# Patient Record
Sex: Male | Born: 1935 | Race: White | Hispanic: No | Marital: Married | Smoking: Never smoker
Health system: Southern US, Community
[De-identification: ages and names within clinical notes are randomized; demographics above are authoritative.]

## PROBLEM LIST (undated history)

## (undated) DIAGNOSIS — C801 Malignant (primary) neoplasm, unspecified: Secondary | ICD-10-CM

## (undated) DIAGNOSIS — I872 Venous insufficiency (chronic) (peripheral): Secondary | ICD-10-CM

## (undated) DIAGNOSIS — I77810 Thoracic aortic ectasia: Secondary | ICD-10-CM

## (undated) DIAGNOSIS — C61 Malignant neoplasm of prostate: Secondary | ICD-10-CM

## (undated) DIAGNOSIS — D509 Iron deficiency anemia, unspecified: Secondary | ICD-10-CM

## (undated) DIAGNOSIS — E785 Hyperlipidemia, unspecified: Secondary | ICD-10-CM

## (undated) DIAGNOSIS — E119 Type 2 diabetes mellitus without complications: Secondary | ICD-10-CM

## (undated) DIAGNOSIS — I10 Essential (primary) hypertension: Secondary | ICD-10-CM

## (undated) DIAGNOSIS — D696 Thrombocytopenia, unspecified: Secondary | ICD-10-CM

## (undated) DIAGNOSIS — G4733 Obstructive sleep apnea (adult) (pediatric): Secondary | ICD-10-CM

## (undated) DIAGNOSIS — I4891 Unspecified atrial fibrillation: Secondary | ICD-10-CM

## (undated) HISTORY — DX: Thrombocytopenia, unspecified: D69.6

## (undated) HISTORY — DX: Malignant neoplasm of prostate: C61

## (undated) HISTORY — DX: Venous insufficiency (chronic) (peripheral): I87.2

## (undated) HISTORY — DX: Hyperlipidemia, unspecified: E78.5

## (undated) HISTORY — DX: Obstructive sleep apnea (adult) (pediatric): G47.33

## (undated) HISTORY — DX: Essential (primary) hypertension: I10

## (undated) HISTORY — DX: Thoracic aortic ectasia: I77.810

## (undated) HISTORY — DX: Unspecified atrial fibrillation: I48.91

## (undated) HISTORY — DX: Malignant (primary) neoplasm, unspecified: C80.1

## (undated) HISTORY — DX: Iron deficiency anemia, unspecified: D50.9

## (undated) HISTORY — DX: Type 2 diabetes mellitus without complications: E11.9

---

## 2004-04-10 ENCOUNTER — Ambulatory Visit: Payer: Self-pay | Admitting: Cardiology

## 2004-04-16 ENCOUNTER — Ambulatory Visit: Payer: Self-pay | Admitting: Cardiology

## 2004-04-23 ENCOUNTER — Ambulatory Visit: Payer: Self-pay | Admitting: Cardiology

## 2004-04-30 ENCOUNTER — Ambulatory Visit: Payer: Self-pay | Admitting: Cardiology

## 2004-05-07 ENCOUNTER — Ambulatory Visit: Payer: Self-pay | Admitting: Cardiology

## 2004-05-14 ENCOUNTER — Ambulatory Visit: Payer: Self-pay | Admitting: Cardiology

## 2004-05-28 ENCOUNTER — Ambulatory Visit: Payer: Self-pay | Admitting: Cardiology

## 2004-06-13 ENCOUNTER — Ambulatory Visit: Payer: Self-pay | Admitting: Cardiology

## 2004-06-21 ENCOUNTER — Ambulatory Visit: Payer: Self-pay | Admitting: Cardiology

## 2004-06-25 ENCOUNTER — Ambulatory Visit: Payer: Self-pay | Admitting: Cardiology

## 2004-06-27 ENCOUNTER — Inpatient Hospital Stay (HOSPITAL_BASED_OUTPATIENT_CLINIC_OR_DEPARTMENT_OTHER): Admission: RE | Admit: 2004-06-27 | Discharge: 2004-06-27 | Payer: Self-pay | Admitting: *Deleted

## 2004-06-27 ENCOUNTER — Ambulatory Visit: Payer: Self-pay | Admitting: *Deleted

## 2004-07-02 ENCOUNTER — Ambulatory Visit: Payer: Self-pay | Admitting: Cardiology

## 2004-07-11 ENCOUNTER — Ambulatory Visit: Payer: Self-pay | Admitting: Cardiology

## 2004-10-18 ENCOUNTER — Ambulatory Visit: Payer: Self-pay | Admitting: Cardiology

## 2004-10-25 ENCOUNTER — Ambulatory Visit: Payer: Self-pay | Admitting: Internal Medicine

## 2004-11-01 ENCOUNTER — Ambulatory Visit: Payer: Self-pay | Admitting: Cardiology

## 2004-11-08 ENCOUNTER — Ambulatory Visit: Payer: Self-pay | Admitting: Internal Medicine

## 2004-11-15 ENCOUNTER — Ambulatory Visit: Payer: Self-pay | Admitting: *Deleted

## 2004-11-22 ENCOUNTER — Ambulatory Visit: Payer: Self-pay | Admitting: Cardiology

## 2004-12-20 ENCOUNTER — Ambulatory Visit: Payer: Self-pay | Admitting: Internal Medicine

## 2005-01-23 ENCOUNTER — Encounter
Admission: RE | Admit: 2005-01-23 | Discharge: 2005-01-23 | Payer: Self-pay | Admitting: Thoracic Surgery (Cardiothoracic Vascular Surgery)

## 2005-02-28 ENCOUNTER — Ambulatory Visit: Payer: Self-pay | Admitting: Internal Medicine

## 2005-03-07 ENCOUNTER — Ambulatory Visit: Payer: Self-pay | Admitting: Cardiology

## 2005-03-14 ENCOUNTER — Ambulatory Visit: Payer: Self-pay | Admitting: *Deleted

## 2005-05-09 ENCOUNTER — Ambulatory Visit: Payer: Self-pay | Admitting: Cardiology

## 2005-06-04 ENCOUNTER — Ambulatory Visit: Payer: Self-pay | Admitting: Cardiology

## 2005-06-17 ENCOUNTER — Ambulatory Visit: Payer: Self-pay | Admitting: Cardiology

## 2005-07-09 ENCOUNTER — Ambulatory Visit: Payer: Self-pay | Admitting: Cardiology

## 2005-07-28 ENCOUNTER — Encounter
Admission: RE | Admit: 2005-07-28 | Discharge: 2005-07-28 | Payer: Self-pay | Admitting: Thoracic Surgery (Cardiothoracic Vascular Surgery)

## 2005-07-29 ENCOUNTER — Ambulatory Visit: Payer: Self-pay | Admitting: Cardiology

## 2005-08-29 ENCOUNTER — Ambulatory Visit: Payer: Self-pay | Admitting: Cardiology

## 2005-09-08 ENCOUNTER — Ambulatory Visit: Payer: Self-pay | Admitting: Cardiology

## 2005-10-22 ENCOUNTER — Ambulatory Visit: Payer: Self-pay | Admitting: Cardiology

## 2005-11-19 ENCOUNTER — Ambulatory Visit: Payer: Self-pay | Admitting: Cardiology

## 2005-12-17 ENCOUNTER — Ambulatory Visit: Payer: Self-pay | Admitting: Cardiology

## 2006-01-26 ENCOUNTER — Ambulatory Visit: Payer: Self-pay | Admitting: Cardiology

## 2006-02-02 ENCOUNTER — Ambulatory Visit: Payer: Self-pay | Admitting: Cardiology

## 2006-02-09 ENCOUNTER — Ambulatory Visit: Payer: Self-pay | Admitting: Cardiology

## 2006-02-12 ENCOUNTER — Encounter: Admission: RE | Admit: 2006-02-12 | Discharge: 2006-02-12 | Payer: Self-pay | Admitting: Internal Medicine

## 2006-03-10 ENCOUNTER — Ambulatory Visit (HOSPITAL_COMMUNITY): Admission: RE | Admit: 2006-03-10 | Discharge: 2006-03-10 | Payer: Self-pay | Admitting: Internal Medicine

## 2006-06-16 ENCOUNTER — Ambulatory Visit: Payer: Self-pay | Admitting: Cardiology

## 2006-07-07 ENCOUNTER — Ambulatory Visit: Payer: Self-pay | Admitting: Cardiology

## 2006-07-21 ENCOUNTER — Ambulatory Visit: Payer: Self-pay | Admitting: Cardiology

## 2006-08-04 ENCOUNTER — Ambulatory Visit: Payer: Self-pay | Admitting: Cardiology

## 2006-08-06 ENCOUNTER — Encounter
Admission: RE | Admit: 2006-08-06 | Discharge: 2006-08-06 | Payer: Self-pay | Admitting: Thoracic Surgery (Cardiothoracic Vascular Surgery)

## 2006-08-06 ENCOUNTER — Ambulatory Visit: Payer: Self-pay | Admitting: Thoracic Surgery (Cardiothoracic Vascular Surgery)

## 2006-08-12 ENCOUNTER — Ambulatory Visit: Payer: Self-pay | Admitting: Cardiology

## 2006-08-12 ENCOUNTER — Encounter: Payer: Self-pay | Admitting: Cardiology

## 2006-08-18 ENCOUNTER — Ambulatory Visit: Payer: Self-pay | Admitting: Cardiology

## 2006-08-21 ENCOUNTER — Ambulatory Visit: Payer: Self-pay | Admitting: Cardiology

## 2006-08-24 ENCOUNTER — Ambulatory Visit: Payer: Self-pay | Admitting: Cardiology

## 2006-08-25 ENCOUNTER — Ambulatory Visit (HOSPITAL_COMMUNITY): Admission: RE | Admit: 2006-08-25 | Discharge: 2006-08-25 | Payer: Self-pay | Admitting: Internal Medicine

## 2006-08-28 ENCOUNTER — Ambulatory Visit: Payer: Self-pay | Admitting: Cardiology

## 2006-09-16 ENCOUNTER — Ambulatory Visit: Payer: Self-pay | Admitting: Cardiology

## 2006-09-23 ENCOUNTER — Ambulatory Visit: Payer: Self-pay | Admitting: Physician Assistant

## 2006-10-27 ENCOUNTER — Ambulatory Visit: Payer: Self-pay | Admitting: Cardiology

## 2006-10-30 ENCOUNTER — Ambulatory Visit: Payer: Self-pay | Admitting: Cardiology

## 2006-11-09 ENCOUNTER — Ambulatory Visit: Payer: Self-pay | Admitting: Physician Assistant

## 2006-11-16 ENCOUNTER — Ambulatory Visit: Payer: Self-pay | Admitting: Physician Assistant

## 2006-11-30 ENCOUNTER — Ambulatory Visit: Payer: Self-pay | Admitting: Cardiology

## 2006-12-22 ENCOUNTER — Ambulatory Visit: Payer: Self-pay | Admitting: Cardiology

## 2007-01-08 ENCOUNTER — Ambulatory Visit: Payer: Self-pay | Admitting: Cardiology

## 2007-01-19 ENCOUNTER — Ambulatory Visit: Payer: Self-pay | Admitting: Cardiology

## 2007-02-16 ENCOUNTER — Ambulatory Visit: Payer: Self-pay | Admitting: Cardiology

## 2007-03-08 ENCOUNTER — Ambulatory Visit: Payer: Self-pay | Admitting: Cardiology

## 2007-03-24 ENCOUNTER — Ambulatory Visit: Payer: Self-pay | Admitting: Cardiology

## 2007-03-30 ENCOUNTER — Ambulatory Visit: Payer: Self-pay | Admitting: Cardiology

## 2007-04-27 ENCOUNTER — Ambulatory Visit: Payer: Self-pay | Admitting: Cardiology

## 2007-05-05 ENCOUNTER — Ambulatory Visit: Payer: Self-pay | Admitting: Cardiology

## 2007-05-19 ENCOUNTER — Ambulatory Visit: Payer: Self-pay | Admitting: Cardiology

## 2007-06-02 ENCOUNTER — Ambulatory Visit: Payer: Self-pay | Admitting: Cardiology

## 2007-06-16 ENCOUNTER — Encounter: Payer: Self-pay | Admitting: Cardiology

## 2007-08-02 ENCOUNTER — Ambulatory Visit: Payer: Self-pay | Admitting: Cardiology

## 2007-08-30 ENCOUNTER — Ambulatory Visit: Payer: Self-pay | Admitting: Cardiology

## 2007-09-28 ENCOUNTER — Ambulatory Visit: Payer: Self-pay | Admitting: Cardiology

## 2007-10-01 ENCOUNTER — Encounter: Payer: Self-pay | Admitting: Cardiology

## 2007-10-18 ENCOUNTER — Encounter: Payer: Self-pay | Admitting: Cardiology

## 2007-10-27 ENCOUNTER — Ambulatory Visit: Payer: Self-pay | Admitting: Cardiology

## 2007-11-24 ENCOUNTER — Ambulatory Visit: Payer: Self-pay | Admitting: Cardiology

## 2007-11-29 ENCOUNTER — Encounter: Payer: Self-pay | Admitting: Cardiology

## 2007-12-02 ENCOUNTER — Encounter: Payer: Self-pay | Admitting: Cardiology

## 2007-12-07 ENCOUNTER — Encounter: Payer: Self-pay | Admitting: Cardiology

## 2007-12-13 ENCOUNTER — Encounter: Payer: Self-pay | Admitting: Cardiology

## 2008-01-03 ENCOUNTER — Ambulatory Visit: Payer: Self-pay | Admitting: Cardiology

## 2008-01-18 ENCOUNTER — Encounter: Payer: Self-pay | Admitting: Cardiology

## 2008-01-20 ENCOUNTER — Ambulatory Visit: Admission: RE | Admit: 2008-01-20 | Discharge: 2008-04-07 | Payer: Self-pay | Admitting: Radiation Oncology

## 2008-02-01 ENCOUNTER — Ambulatory Visit: Payer: Self-pay | Admitting: Cardiology

## 2008-02-15 ENCOUNTER — Ambulatory Visit: Payer: Self-pay | Admitting: Cardiology

## 2008-02-21 ENCOUNTER — Ambulatory Visit: Payer: Self-pay | Admitting: Cardiology

## 2008-02-21 ENCOUNTER — Encounter: Payer: Self-pay | Admitting: Cardiology

## 2008-02-29 ENCOUNTER — Ambulatory Visit: Payer: Self-pay | Admitting: Cardiology

## 2008-03-21 ENCOUNTER — Ambulatory Visit: Payer: Self-pay | Admitting: Cardiology

## 2008-04-18 ENCOUNTER — Ambulatory Visit: Payer: Self-pay | Admitting: Cardiology

## 2008-05-05 HISTORY — PX: OTHER SURGICAL HISTORY: SHX169

## 2008-05-16 ENCOUNTER — Ambulatory Visit: Payer: Self-pay | Admitting: Cardiology

## 2008-06-13 ENCOUNTER — Ambulatory Visit: Payer: Self-pay | Admitting: Cardiology

## 2008-07-21 ENCOUNTER — Ambulatory Visit: Payer: Self-pay | Admitting: Cardiology

## 2008-08-11 ENCOUNTER — Ambulatory Visit: Payer: Self-pay | Admitting: Cardiology

## 2008-09-12 ENCOUNTER — Ambulatory Visit: Payer: Self-pay | Admitting: Cardiology

## 2008-09-18 ENCOUNTER — Encounter: Payer: Self-pay | Admitting: Cardiology

## 2008-10-04 ENCOUNTER — Ambulatory Visit: Payer: Self-pay | Admitting: Cardiology

## 2008-10-13 ENCOUNTER — Ambulatory Visit: Payer: Self-pay | Admitting: Cardiology

## 2008-10-24 ENCOUNTER — Ambulatory Visit: Payer: Self-pay | Admitting: Thoracic Surgery (Cardiothoracic Vascular Surgery)

## 2008-10-24 ENCOUNTER — Encounter: Payer: Self-pay | Admitting: Cardiology

## 2008-10-24 ENCOUNTER — Encounter
Admission: RE | Admit: 2008-10-24 | Discharge: 2008-10-24 | Payer: Self-pay | Admitting: Thoracic Surgery (Cardiothoracic Vascular Surgery)

## 2008-11-07 ENCOUNTER — Encounter: Payer: Self-pay | Admitting: Cardiology

## 2008-11-10 ENCOUNTER — Encounter: Payer: Self-pay | Admitting: Cardiology

## 2008-11-23 ENCOUNTER — Encounter: Payer: Self-pay | Admitting: Cardiology

## 2008-12-18 ENCOUNTER — Encounter: Payer: Self-pay | Admitting: *Deleted

## 2008-12-25 ENCOUNTER — Encounter: Payer: Self-pay | Admitting: Cardiology

## 2009-01-02 ENCOUNTER — Ambulatory Visit: Payer: Self-pay | Admitting: Cardiology

## 2009-01-17 ENCOUNTER — Encounter: Payer: Self-pay | Admitting: Cardiology

## 2009-01-26 ENCOUNTER — Ambulatory Visit: Payer: Self-pay | Admitting: Cardiology

## 2009-01-26 ENCOUNTER — Encounter (INDEPENDENT_AMBULATORY_CARE_PROVIDER_SITE_OTHER): Payer: Self-pay | Admitting: Cardiology

## 2009-01-26 LAB — CONVERTED CEMR LAB: POC INR: 2

## 2009-02-14 ENCOUNTER — Encounter: Payer: Self-pay | Admitting: Cardiology

## 2009-02-19 DIAGNOSIS — I4891 Unspecified atrial fibrillation: Secondary | ICD-10-CM | POA: Insufficient documentation

## 2009-02-20 ENCOUNTER — Encounter: Payer: Self-pay | Admitting: Cardiology

## 2009-02-23 ENCOUNTER — Ambulatory Visit: Payer: Self-pay | Admitting: Cardiology

## 2009-02-23 LAB — CONVERTED CEMR LAB: POC INR: 2.7

## 2009-03-15 ENCOUNTER — Encounter (INDEPENDENT_AMBULATORY_CARE_PROVIDER_SITE_OTHER): Payer: Self-pay | Admitting: *Deleted

## 2009-03-16 ENCOUNTER — Ambulatory Visit: Payer: Self-pay | Admitting: Cardiology

## 2009-03-16 ENCOUNTER — Encounter: Payer: Self-pay | Admitting: Cardiology

## 2009-03-16 DIAGNOSIS — I719 Aortic aneurysm of unspecified site, without rupture: Secondary | ICD-10-CM | POA: Insufficient documentation

## 2009-03-16 DIAGNOSIS — I10 Essential (primary) hypertension: Secondary | ICD-10-CM

## 2009-03-16 DIAGNOSIS — E782 Mixed hyperlipidemia: Secondary | ICD-10-CM

## 2009-03-23 ENCOUNTER — Ambulatory Visit: Payer: Self-pay | Admitting: Cardiology

## 2009-03-30 ENCOUNTER — Ambulatory Visit: Payer: Self-pay | Admitting: Cardiology

## 2009-04-24 ENCOUNTER — Encounter: Payer: Self-pay | Admitting: Cardiology

## 2009-05-08 ENCOUNTER — Ambulatory Visit: Payer: Self-pay | Admitting: Cardiology

## 2009-06-05 ENCOUNTER — Ambulatory Visit: Payer: Self-pay | Admitting: Cardiology

## 2009-06-05 LAB — CONVERTED CEMR LAB: POC INR: 2

## 2009-06-18 ENCOUNTER — Telehealth (INDEPENDENT_AMBULATORY_CARE_PROVIDER_SITE_OTHER): Payer: Self-pay | Admitting: *Deleted

## 2009-08-17 ENCOUNTER — Ambulatory Visit: Payer: Self-pay | Admitting: Cardiology

## 2009-08-17 LAB — CONVERTED CEMR LAB: POC INR: 1.5

## 2009-09-21 ENCOUNTER — Ambulatory Visit: Payer: Self-pay | Admitting: Cardiology

## 2009-10-10 ENCOUNTER — Ambulatory Visit: Payer: Self-pay | Admitting: Thoracic Surgery (Cardiothoracic Vascular Surgery)

## 2009-10-10 ENCOUNTER — Encounter
Admission: RE | Admit: 2009-10-10 | Discharge: 2009-10-10 | Payer: Self-pay | Admitting: Thoracic Surgery (Cardiothoracic Vascular Surgery)

## 2009-10-10 ENCOUNTER — Encounter: Payer: Self-pay | Admitting: Cardiology

## 2009-10-19 ENCOUNTER — Ambulatory Visit: Payer: Self-pay | Admitting: Cardiology

## 2009-10-19 LAB — CONVERTED CEMR LAB: POC INR: 1.8

## 2009-11-13 ENCOUNTER — Ambulatory Visit: Payer: Self-pay | Admitting: Cardiology

## 2009-11-13 LAB — CONVERTED CEMR LAB: POC INR: 1.9

## 2009-12-11 ENCOUNTER — Ambulatory Visit: Payer: Self-pay | Admitting: Cardiology

## 2009-12-11 LAB — CONVERTED CEMR LAB: POC INR: 2.7

## 2010-01-08 ENCOUNTER — Ambulatory Visit: Payer: Self-pay | Admitting: Cardiology

## 2010-05-28 ENCOUNTER — Ambulatory Visit
Admission: RE | Admit: 2010-05-28 | Discharge: 2010-05-28 | Payer: Self-pay | Source: Home / Self Care | Attending: Cardiology | Admitting: Cardiology

## 2010-05-28 ENCOUNTER — Encounter: Payer: Self-pay | Admitting: Cardiology

## 2010-06-04 NOTE — Medication Information (Signed)
Summary: ccr-lr  Anticoagulant Therapy  Managed by: Vashti Hey, RN PCP: Dr. Doreen Beam Supervising MD: Antoine Poche MD, Fayrene Fearing Indication 1: Atrial Fibrillation (ICD-427.31) Lab Used: [facility] [place] Site: [place] INR POC 2.7  Dietary changes: no    Health status changes: no    Bleeding/hemorrhagic complications: no    Recent/future hospitalizations: no    Any changes in medication regimen? no    Recent/future dental: no  Any missed doses?: no       Is patient compliant with meds? yes       Allergies: No Known Drug Allergies  Anticoagulation Management History:      The patient is taking warfarin and comes in today for a routine follow up visit.  Positive risk factors for bleeding include an age of 75 years or older.  The bleeding index is 'intermediate risk'.  Positive CHADS2 values include History of HTN.  Negative CHADS2 values include Age > 60 years old.  The start date was 09/06/2004.  Anticoagulation responsible provider: Antoine Poche MD, Fayrene Fearing.  INR POC: 2.7.  Cuvette Lot#: [id number].  Exp: 10/11.    Anticoagulation Management Assessment/Plan:      The patient's current anticoagulation dose is Warfarin sodium 5 mg tabs: Use as directed by Anticoagulation Clinic.  The target INR is 2 - 3.  The next INR is due 01/08/2010.  Anticoagulation instructions were given to patient.  Results were reviewed/authorized by Vashti Hey, RN.  He was notified by Vashti Hey RN.         Prior Anticoagulation Instructions: INR 1.9 Take coumadin 10mg  x 2 then resume 7.5mg  once daily except 5mg  on Fridays  Current Anticoagulation Instructions: INR 2.7 Continue coumadin 7.5mg  once daily except 5mg  on Fridays

## 2010-06-04 NOTE — Medication Information (Signed)
Summary: ccr-lr  Anticoagulant Therapy  Managed by: Vashti Hey, RN PCP: Dr. Doreen Beam Supervising MD: Andee Lineman MD, Michelle Piper Indication 1: Atrial Fibrillation (ICD-427.31) Lab Used: [facility] [place] Site: [place] INR POC 1.8  Dietary changes: no    Health status changes: no    Bleeding/hemorrhagic complications: no    Recent/future hospitalizations: no    Any changes in medication regimen? no    Recent/future dental: no  Any missed doses?: no       Is patient compliant with meds? yes       Allergies: No Known Drug Allergies  Anticoagulation Management History:      The patient is taking warfarin and comes in today for a routine follow up visit.  Positive risk factors for bleeding include an age of 75 years or older.  The bleeding index is 'intermediate risk'.  Positive CHADS2 values include History of HTN.  Negative CHADS2 values include Age > 61 years old.  The start date was 09/06/2004.  Anticoagulation responsible provider: Andee Lineman MD, Michelle Piper.  INR POC: 1.8.  Cuvette Lot#: [id number].  Exp: 10/11.    Anticoagulation Management Assessment/Plan:      The patient's current anticoagulation dose is Warfarin sodium 5 mg tabs: Use as directed by Anticoagulation Clinic.  The target INR is 2 - 3.  The next INR is due 11/13/2009.  Anticoagulation instructions were given to patient.  Results were reviewed/authorized by Vashti Hey, RN.  He was notified by Vashti Hey RN.         Prior Anticoagulation Instructions: INR 3.0 Continue coumadin 7.5mg  once daily except 5mg  on Fridays  Current Anticoagulation Instructions: INR 1.8 Take coumadin 7.5mg  once daily except 5mg  on Fridays

## 2010-06-04 NOTE — Medication Information (Signed)
Summary: ccr ck  Anticoagulant Therapy  Managed by: Darren Hey, RN PCP: Dr. Doreen Beam Supervising MD: Andee Lineman MD, Michelle Piper Indication 1: Atrial Fibrillation (ICD-427.31) Lab Used: [facility] [place] Site: [place] INR POC 1.5  Dietary changes: no    Health status changes: no    Bleeding/hemorrhagic complications: no    Recent/future hospitalizations: no    Any changes in medication regimen? no    Recent/future dental: no  Any missed doses?: no       Is patient compliant with meds? yes       Allergies: No Known Drug Allergies  Anticoagulation Management History:      The patient is taking warfarin and comes in today for a routine follow up visit.  Positive risk factors for bleeding include an age of 75 years or older.  The bleeding index is 'intermediate risk'.  Positive CHADS2 values include History of HTN.  Negative CHADS2 values include Age > 41 years old.  The start date was 09/06/2004.  Anticoagulation responsible provider: Andee Lineman MD, Michelle Piper.  INR POC: 1.5.  Cuvette Lot#: [id number].  Exp: 10/11.    Anticoagulation Management Assessment/Plan:      The patient's current anticoagulation dose is Warfarin sodium 5 mg tabs: Use as directed by Anticoagulation Clinic.  The target INR is 2 - 3.  The next INR is due 09/07/2009.  Anticoagulation instructions were given to patient.  Results were reviewed/authorized by Darren Hey, RN.  He was notified by Darren Hey RN.         Prior Anticoagulation Instructions: INR 2.0 Increase coumadin to 7.5mg  once daily except 5mg  on M,W,F  Current Anticoagulation Instructions: INR 1.5 Take coumadin 2 tablets tonight and tomorrow night then increase coumadin to 1 1/2 tablets once daily except 1 tablet on Fridays

## 2010-06-04 NOTE — Progress Notes (Signed)
Summary: COUMADIN STOP DATE PRIOR/TEETH EXTRACTIONS   Phone Note Call from Patient   Caller: Patient Call For: nurse Summary of Call: patient having teeth extraction and want to know when to stop coumadin before procedure. Nurse informed patient via message machine to have dentist office contact us directly.  Pt calling about samething need to get scheduled asap. Initial call taken by: Carlye Grippe,  June 18, 2009 11:44 AM  Follow-up for Phone Call        Dentist office is Dr.George Stermer [phone]. called and left message on machine that we need request r/e coumadin to come from their office either by fax or phone. fax-[phone] Follow-up by: Carlye Grippe,  June 19, 2009 1:25 PM  Additional Follow-up for Phone Call Additional follow up Details #1::        spoke with staff at dental office and they confirmed that patient is having one extraction , add a class to existing partial procedure and would like to know when to stop coumadin prior to procedure?  Additional Follow-up by: Carlye Grippe,  June 19, 2009 2:07 PM    Additional Follow-up for Phone Call Additional follow up Details #2::    Is it OK fro pt to hold couamdin 3 days prior to dental extraction? Follow-up by: Vashti Hey RN,  June 20, 2009 1:02 PM  Additional Follow-up for Phone Call Additional follow up Details #3:: Details for Additional Follow-up Action Taken: Could hold Coumadin 3-4 days prior to procedure and resume as soon after procedure as felt safe from a bleeding and healing perspective.  He will need a f/u PT/INR 4-5 days after resuming Coumadin.  information faxed to the dentist office.  Additional Follow-up by: Loreli Slot, MD, [facility],  June 20, 2009 7:39 PM

## 2010-06-04 NOTE — Medication Information (Signed)
Summary: ccr-lr  Anticoagulant Therapy  Managed by: Vashti Hey, RN PCP: Dr. Doreen Beam Supervising MD: Andee Lineman MD, Michelle Piper Indication 1: Atrial Fibrillation (ICD-427.31) Lab Used: [facility] [place] Site: [place] INR POC 2.0  Dietary changes: no    Health status changes: no    Bleeding/hemorrhagic complications: no    Recent/future hospitalizations: no    Any changes in medication regimen? no    Recent/future dental: no  Any missed doses?: no       Is patient compliant with meds? yes       Allergies: No Known Drug Allergies  Anticoagulation Management History:      The patient is taking warfarin and comes in today for a routine follow up visit.  Positive risk factors for bleeding include an age of 75 years or older.  The bleeding index is 'intermediate risk'.  Positive CHADS2 values include History of HTN.  Negative CHADS2 values include Age > 63 years old.  The start date was 09/06/2004.  Anticoagulation responsible provider: Andee Lineman MD, Michelle Piper.  INR POC: 2.0.  Cuvette Lot#: [id number].  Exp: 10/11.    Anticoagulation Management Assessment/Plan:      The patient's current anticoagulation dose is Warfarin sodium 5 mg tabs: Use as directed by Anticoagulation Clinic.  The target INR is 2 - 3.  The next INR is due 06/29/2009.  Anticoagulation instructions were given to patient.  Results were reviewed/authorized by Vashti Hey, RN.  He was notified by Vashti Hey RN.         Prior Anticoagulation Instructions: INR 2.0 Take coumadin 10mg  tonight then resume 5mg  once daily excdept 7.5mg  on T,Th,Sat  Current Anticoagulation Instructions: INR 2.0 Increase coumadin to 7.5mg  once daily except 5mg  on M,W,F

## 2010-06-04 NOTE — Medication Information (Signed)
Summary: ccr-lr  Anticoagulant Therapy  Managed by: Vashti Hey, RN PCP: Dr. Doreen Beam Supervising MD: Antoine Poche MD, Fayrene Fearing Indication 1: Atrial Fibrillation (ICD-427.31) Lab Used: [facility] [place] Site: [place] INR POC 3.1  Dietary changes: no    Health status changes: no    Bleeding/hemorrhagic complications: no    Recent/future hospitalizations: no    Any changes in medication regimen? no    Recent/future dental: no  Any missed doses?: no       Is patient compliant with meds? yes       Allergies: No Known Drug Allergies  Anticoagulation Management History:      The patient is taking warfarin and comes in today for a routine follow up visit.  Positive risk factors for bleeding include an age of 75 years or older.  The bleeding index is 'intermediate risk'.  Positive CHADS2 values include History of HTN.  Negative CHADS2 values include Age > 2 years old.  The start date was 09/06/2004.  Anticoagulation responsible provider: Antoine Poche MD, Fayrene Fearing.  INR POC: 3.1.  Cuvette Lot#: [id number].  Exp: 10/11.    Anticoagulation Management Assessment/Plan:      The patient's current anticoagulation dose is Warfarin sodium 5 mg tabs: Use as directed by Anticoagulation Clinic.  The target INR is 2 - 3.  The next INR is due 02/05/2010.  Anticoagulation instructions were given to patient.  Results were reviewed/authorized by Vashti Hey, RN.  He was notified by Vashti Hey RN.         Prior Anticoagulation Instructions: INR 2.7 Continue coumadin 7.5mg  once daily except 5mg  on Fridays  Current Anticoagulation Instructions: INR 3.1 Continue coumadin 7.5mg  once daily except 5mg  on Fridays

## 2010-06-04 NOTE — Letter (Signed)
Summary: [facility]   [facility]   Imported By: Roderic Ovens 11/27/2009 11:47:19  _____________________________________________________________________  External Attachment:    Type:   Image     Comment:   External Document

## 2010-06-04 NOTE — Medication Information (Signed)
Summary: ccr-lr  Anticoagulant Therapy  Managed by: Vashti Hey, RN PCP: Dr. Doreen Beam Supervising MD: Andee Lineman MD, Michelle Piper Indication 1: Atrial Fibrillation (ICD-427.31) Lab Used: [facility] [place] Site: [place] INR POC 1.9  Dietary changes: no    Health status changes: no    Bleeding/hemorrhagic complications: no    Recent/future hospitalizations: no    Any changes in medication regimen? no    Recent/future dental: no  Any missed doses?: yes     Details: might have missed a dose  wife has been out of town and she prepares meds  Is patient compliant with meds? yes       Allergies: No Known Drug Allergies  Anticoagulation Management History:      The patient is taking warfarin and comes in today for a routine follow up visit.  Positive risk factors for bleeding include an age of 11 years or older.  The bleeding index is 'intermediate risk'.  Positive CHADS2 values include History of HTN.  Negative CHADS2 values include Age > 18 years old.  The start date was 09/06/2004.  Anticoagulation responsible provider: Andee Lineman MD, Michelle Piper.  INR POC: 1.9.  Cuvette Lot#: [id number].  Exp: 10/11.    Anticoagulation Management Assessment/Plan:      The patient's current anticoagulation dose is Warfarin sodium 5 mg tabs: Use as directed by Anticoagulation Clinic.  The target INR is 2 - 3.  The next INR is due 12/11/2009.  Anticoagulation instructions were given to patient.  Results were reviewed/authorized by Vashti Hey, RN.  He was notified by Vashti Hey RN.         Prior Anticoagulation Instructions: INR 1.8 Take coumadin 7.5mg  once daily except 5mg  on Fridays  Current Anticoagulation Instructions: INR 1.9 Take coumadin 10mg  x 2 then resume 7.5mg  once daily except 5mg  on Fridays

## 2010-06-04 NOTE — Medication Information (Signed)
Summary: [facility]-MISSED APPT 5/13 B/C FORGOT-[facility]  Anticoagulant Therapy  Managed by: Vashti Hey, RN PCP: Dr. Doreen Beam Supervising MD: Andee Lineman MD, Michelle Piper Indication 1: Atrial Fibrillation (ICD-427.31) Lab Used: [facility] [place] Site: [place] INR POC 3.0  Dietary changes: no    Health status changes: no    Bleeding/hemorrhagic complications: no    Recent/future hospitalizations: no    Any changes in medication regimen? no    Recent/future dental: no  Any missed doses?: no       Is patient compliant with meds? yes       Allergies: No Known Drug Allergies  Anticoagulation Management History:      The patient is taking warfarin and comes in today for a routine follow up visit.  Positive risk factors for bleeding include an age of 75 years or older.  The bleeding index is 'intermediate risk'.  Positive CHADS2 values include History of HTN.  Negative CHADS2 values include Age > 75 years old.  The start date was 09/06/2004.  Anticoagulation responsible provider: Andee Lineman MD, Michelle Piper.  INR POC: 3.0.  Cuvette Lot#: [id number].  Exp: 10/11.    Anticoagulation Management Assessment/Plan:      The patient's current anticoagulation dose is Warfarin sodium 5 mg tabs: Use as directed by Anticoagulation Clinic.  The target INR is 2 - 3.  The next INR is due 10/19/2009.  Anticoagulation instructions were given to patient.  Results were reviewed/authorized by Vashti Hey, RN.  He was notified by Vashti Hey RN.         Prior Anticoagulation Instructions: INR 1.5 Take coumadin 2 tablets tonight and tomorrow night then increase coumadin to 1 1/2 tablets once daily except 1 tablet on Fridays  Current Anticoagulation Instructions: INR 3.0 Continue coumadin 7.5mg  once daily except 5mg  on Fridays

## 2010-06-04 NOTE — Medication Information (Signed)
Summary: [facility]-R/S FROM NO SHOW  Anticoagulant Therapy  Managed by: Vashti Hey, RN PCP: Dr. Doreen Beam Supervising MD: Andee Lineman MD, Michelle Piper Indication 1: Atrial Fibrillation (ICD-427.31) Lab Used: [facility] [place] Site: [place] INR POC 2.0  Dietary changes: no    Health status changes: no    Bleeding/hemorrhagic complications: no    Recent/future hospitalizations: no    Any changes in medication regimen? no    Recent/future dental: no  Any missed doses?: no       Is patient compliant with meds? yes       Allergies: No Known Drug Allergies  Anticoagulation Management History:      The patient is taking warfarin and comes in today for a routine follow up visit.  Positive risk factors for bleeding include an age of 75 years or older.  The bleeding index is 'intermediate risk'.  Positive CHADS2 values include History of HTN.  Negative CHADS2 values include Age > 63 years old.  The start date was 09/06/2004.  Anticoagulation responsible provider: Andee Lineman MD, Michelle Piper.  INR POC: 2.0.  Cuvette Lot#: [id number].  Exp: 10/11.    Anticoagulation Management Assessment/Plan:      The patient's current anticoagulation dose is Warfarin sodium 5 mg tabs: Use as directed by Anticoagulation Clinic.  The target INR is 2 - 3.  The next INR is due 06/05/2009.  Anticoagulation instructions were given to patient.  Results were reviewed/authorized by Vashti Hey, RN.  He was notified by Vashti Hey RN.         Prior Anticoagulation Instructions: INR 1.9 Take coumadin 10mg  tonight then resume 5mg  once daily except 7.5mg  on T,Th,Sat  Current Anticoagulation Instructions: INR 2.0 Take coumadin 10mg  tonight then resume 5mg  once daily excdept 7.5mg  on T,Th,Sat

## 2010-06-06 NOTE — Medication Information (Signed)
Summary: Coumadin Clinic  Anticoagulant Therapy  Managed by: Vashti Hey, RN PCP: Dr. Doreen Beam Supervising MD: Diona Browner MD, Remi Deter Indication 1: Atrial Fibrillation (ICD-427.31) Lab Used: [facility] [place] Site: [place] INR POC 2.4  Dietary changes: no    Health status changes: no    Bleeding/hemorrhagic complications: no    Recent/future hospitalizations: no    Any changes in medication regimen? no     Any missed doses?: yes     Details: Missed yesterday  Is patient compliant with meds? yes       Allergies: No Known Drug Allergies  Anticoagulation Management History:      The patient is taking warfarin and comes in today for a routine follow up visit.  Positive risk factors for bleeding include an age of 39 years or older.  The bleeding index is 'intermediate risk'.  Positive CHADS2 values include History of HTN and Age > 62 years old.  The start date was 09/06/2004.  Anticoagulation responsible provider: Diona Browner MD, Remi Deter.  INR POC: 2.4.  Cuvette Lot#: [id number].  Exp: 10/11.    Anticoagulation Management Assessment/Plan:      The patient's current anticoagulation dose is Warfarin sodium 5 mg tabs: Use as directed by Anticoagulation Clinic.  The target INR is 2 - 3.  The next INR is due 06/25/2010.  Anticoagulation instructions were given to patient.  Results were reviewed/authorized by Vashti Hey, RN.  He was notified by Vashti Hey RN.         Prior Anticoagulation Instructions: INR 3.1 Continue coumadin 7.5mg  once daily except 5mg  on Fridays  Current Anticoagulation Instructions: INR 2.4 Continue coumadin 7.5mg  once daily except 5mg  on Fridays

## 2010-06-25 ENCOUNTER — Encounter: Payer: Self-pay | Admitting: Cardiology

## 2010-06-25 ENCOUNTER — Encounter (INDEPENDENT_AMBULATORY_CARE_PROVIDER_SITE_OTHER): Payer: Medicare Other

## 2010-06-25 DIAGNOSIS — I4891 Unspecified atrial fibrillation: Secondary | ICD-10-CM

## 2010-06-25 DIAGNOSIS — Z7901 Long term (current) use of anticoagulants: Secondary | ICD-10-CM

## 2010-06-25 LAB — CONVERTED CEMR LAB: POC INR: 2.5

## 2010-07-02 NOTE — Medication Information (Signed)
Summary: ccr-lr  Anticoagulant Therapy  Managed by: Vashti Hey, RN PCP: Dr. Doreen Beam Supervising MD: Diona Browner MD, Remi Deter Indication 1: Atrial Fibrillation (ICD-427.31) Lab Used: [facility] [place] Site: [place] INR POC 2.5  Dietary changes: no    Health status changes: no    Bleeding/hemorrhagic complications: no    Recent/future hospitalizations: no    Any changes in medication regimen? no    Recent/future dental: no  Any missed doses?: no       Is patient compliant with meds? yes       Allergies: No Known Drug Allergies  Anticoagulation Management History:      The patient is taking warfarin and comes in today for a routine follow up visit.  Positive risk factors for bleeding include an age of 75 years or older.  The bleeding index is 'intermediate risk'.  Positive CHADS2 values include History of HTN and Age > 75 years old.  The start date was 09/06/2004.  Anticoagulation responsible provider: Diona Browner MD, Remi Deter.  INR POC: 2.5.  Cuvette Lot#: [id number].  Exp: 10/11.    Anticoagulation Management Assessment/Plan:      The patient's current anticoagulation dose is Warfarin sodium 5 mg tabs: Use as directed by Anticoagulation Clinic.  The target INR is 2 - 3.  The next INR is due 07/23/2010.  Anticoagulation instructions were given to patient.  Results were reviewed/authorized by Vashti Hey, RN.  He was notified by Vashti Hey RN.         Prior Anticoagulation Instructions: INR 2.4 Continue coumadin 7.5mg  once daily except 5mg  on Fridays  Current Anticoagulation Instructions: INR 2.5 Continue coumadin 7.5mg  once daily except 5mg  on Fridays

## 2010-07-09 ENCOUNTER — Ambulatory Visit: Payer: Self-pay | Admitting: Cardiology

## 2010-07-11 ENCOUNTER — Ambulatory Visit: Payer: Self-pay | Admitting: Cardiology

## 2010-07-19 ENCOUNTER — Encounter: Payer: Self-pay | Admitting: Cardiology

## 2010-07-19 DIAGNOSIS — I4891 Unspecified atrial fibrillation: Secondary | ICD-10-CM

## 2010-07-19 DIAGNOSIS — Z7901 Long term (current) use of anticoagulants: Secondary | ICD-10-CM | POA: Insufficient documentation

## 2010-07-23 ENCOUNTER — Ambulatory Visit (INDEPENDENT_AMBULATORY_CARE_PROVIDER_SITE_OTHER): Payer: Medicare Other | Admitting: *Deleted

## 2010-07-23 DIAGNOSIS — I4891 Unspecified atrial fibrillation: Secondary | ICD-10-CM

## 2010-07-23 DIAGNOSIS — Z7901 Long term (current) use of anticoagulants: Secondary | ICD-10-CM

## 2010-07-23 LAB — POCT INR: INR: 2.6

## 2010-07-23 NOTE — Patient Instructions (Signed)
INR  2.6 Continue coumadin 7.5mg  qd except 5mg  on Fridays

## 2010-08-01 ENCOUNTER — Ambulatory Visit: Payer: Medicare Other | Admitting: Cardiology

## 2010-08-13 ENCOUNTER — Encounter: Payer: Self-pay | Admitting: Cardiology

## 2010-08-13 NOTE — Progress Notes (Signed)
This encounter was created in error - please disregard.

## 2010-08-16 ENCOUNTER — Encounter: Payer: Self-pay | Admitting: Cardiology

## 2010-08-19 ENCOUNTER — Ambulatory Visit (INDEPENDENT_AMBULATORY_CARE_PROVIDER_SITE_OTHER): Payer: Medicare Other | Admitting: Cardiology

## 2010-08-19 ENCOUNTER — Encounter: Payer: Self-pay | Admitting: Cardiology

## 2010-08-19 VITALS — BP 122/70 | HR 75 | Ht 70.0 in | Wt 305.0 lb

## 2010-08-19 DIAGNOSIS — I719 Aortic aneurysm of unspecified site, without rupture: Secondary | ICD-10-CM

## 2010-08-19 DIAGNOSIS — I4891 Unspecified atrial fibrillation: Secondary | ICD-10-CM

## 2010-08-19 DIAGNOSIS — I10 Essential (primary) hypertension: Secondary | ICD-10-CM

## 2010-08-19 NOTE — Assessment & Plan Note (Signed)
Ascending thoracic aortic aneurysm measuring 4.6 cm, followed by Dr. Dorris Fetch.

## 2010-08-19 NOTE — Progress Notes (Signed)
Clinical Summary Darren Parker is a 77 y.o.male presenting for followup. He was last seen in November 2010. Continues to follow in the Coumadin clinic. Last visit with thoracic surgery was in June 2011. Followup chest CT is anticipated this summer.  He reports no chest pain, has NYHA class 2-3 dyspnea on exertion which is stable. No reported palpitations or bleeding problems, continues on Coumadin.  I recheck blood pressure during his visit today, noted above. Suspect that the original measurement was inaccurate.   Allergies  Allergen Reactions  . Sulfa Drugs Cross Reactors     Current outpatient prescriptions:allopurinol (ZYLOPRIM) 300 MG tablet, Take 300 mg by mouth daily. , Disp: , Rfl: ;  amLODipine (NORVASC) 5 MG tablet, Take 5 mg by mouth daily.  , Disp: , Rfl: ;  calcium-vitamin D (OSCAL) 250-125 MG-UNIT per tablet, Take 1 tablet by mouth 2 (two) times daily.  , Disp: , Rfl: ;  carvedilol (COREG) 6.25 MG tablet, Take 6.25 mg by mouth 2 (two) times daily with a meal.  , Disp: , Rfl:  Cholecalciferol (VITAMIN D) 1000 UNITS capsule, Take 1,000 Units by mouth daily.  , Disp: , Rfl: ;  doxazosin (CARDURA) 4 MG tablet, Take 4 mg by mouth at bedtime. , Disp: , Rfl: ;  levothyroxine (SYNTHROID, LEVOTHROID) 112 MCG tablet, Take 112 mcg by mouth daily. , Disp: , Rfl: ;  metFORMIN (GLUCOPHAGE) 500 MG tablet, Take 500 mg by mouth 2 (two) times daily with a meal. 2 tabs bid, Disp: , Rfl:  potassium chloride SA (K-DUR,KLOR-CON) 20 MEQ tablet, Take 20 mEq by mouth 2 (two) times daily. , Disp: , Rfl: ;  pravastatin (PRAVACHOL) 40 MG tablet, Take 40 mg by mouth daily.  , Disp: , Rfl: ;  triamterene-hydrochlorothiazide (MAXZIDE-25) 37.5-25 MG per tablet, Take 1 tablet by mouth daily.  , Disp: , Rfl: ;  warfarin (COUMADIN) 5 MG tablet, Take by mouth as directed.  , Disp: , Rfl:   Past Medical History  Diagnosis Date  . Atrial fibrillation   . Hyperlipidemia   . Hypertension   . Obstructive sleep apnea     . Type 2 diabetes mellitus   . Dilation of thoracic aorta     Ascending - CT scan 10/2008, 4.3 cm proximal   . 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 Prostate cancer   . Thrombocytopenia   . Iron deficiency anemia   . Venous insufficiency     Social History Darren Parker reports that he has never smoked. He has never used smokeless tobacco. Darren Parker reports that he does not drink alcohol.  Review of Systems Reports increased weight, complicated by hormone treatments for prostate cancer. Not exercising regularly. Otherwise reviewed and negative except as outlined.  Physical Examination Filed Vitals:   08/19/10 0915  BP: 122/70  Pulse: 75  Morbidly obese male, in no acute distress. HEENT: Conjunctiva and lids normal, oropharynx with moist mucosa. Neck: Increased girth, no obvious carotid bruits or thyromegaly. Lungs: Clear to auscultation, nonlabored. Diminished. Cardiac: Indistinct PMI, irregularly irregular, no S3 gallop. Abdomen: No hepatomegaly, bowel sounds present, nontender. Extremities: Chronic venous stasis and 1+ pitting edema with 1+ pulses. Skin: Warm and dry. No ecchymosis or petechia. Musculoskeletal: No gross deformities. Neuropsychiatric: Alert and oriented x3, affect appropriate.   Problem List and Plan

## 2010-08-19 NOTE — Assessment & Plan Note (Signed)
Recheck blood pressure today was normal. No changes made to present regimen. I encouraged weight loss, sodium restriction. Continue follow up with primary care provider.

## 2010-08-19 NOTE — Assessment & Plan Note (Signed)
Rate controlled today, reportedly tolerating Coumadin without bleeding problems. Continue regular follow up in the Coumadin clinic.

## 2010-08-19 NOTE — Patient Instructions (Signed)
   The current medical regimen is effective;  continue present plan and medications.  Your physician wants you to follow up in:  1 year.  You will receive a reminder letter in the mail one-two months in advance.  If you don't receive a letter, please call our office to schedule the follow up appointment

## 2010-08-20 ENCOUNTER — Ambulatory Visit (INDEPENDENT_AMBULATORY_CARE_PROVIDER_SITE_OTHER): Payer: Medicare Other | Admitting: *Deleted

## 2010-08-20 DIAGNOSIS — Z7901 Long term (current) use of anticoagulants: Secondary | ICD-10-CM

## 2010-08-20 DIAGNOSIS — I4891 Unspecified atrial fibrillation: Secondary | ICD-10-CM

## 2010-08-20 LAB — POCT INR: INR: 2.4

## 2010-09-03 ENCOUNTER — Other Ambulatory Visit: Payer: Self-pay | Admitting: Thoracic Surgery (Cardiothoracic Vascular Surgery)

## 2010-09-03 DIAGNOSIS — I712 Thoracic aortic aneurysm, without rupture: Secondary | ICD-10-CM

## 2010-09-03 DIAGNOSIS — C8590 Non-Hodgkin lymphoma, unspecified, unspecified site: Secondary | ICD-10-CM

## 2010-09-03 DIAGNOSIS — C61 Malignant neoplasm of prostate: Secondary | ICD-10-CM

## 2010-09-17 ENCOUNTER — Ambulatory Visit (INDEPENDENT_AMBULATORY_CARE_PROVIDER_SITE_OTHER): Payer: Medicare Other | Admitting: *Deleted

## 2010-09-17 DIAGNOSIS — I4891 Unspecified atrial fibrillation: Secondary | ICD-10-CM

## 2010-09-17 DIAGNOSIS — Z7901 Long term (current) use of anticoagulants: Secondary | ICD-10-CM

## 2010-09-17 NOTE — Assessment & Plan Note (Signed)
OFFICE VISIT   Darren Parker, Darren Parker  DOB:  1932-07-13                                        October 24, 2008  CHART #:  [id number]   The patient is a 77 year old gentleman, who has been followed in the  office since April 2006 with enlarged ascending aorta on last evaluation  in April 2008.  This aneurysm was stable.  He now returns with a  followup scan.   Past medical history is reviewed and is on the chart.  He has a history  of hypertension, hypercholesterolemia, sleep apnea, arthritis, gout,  obesity, venous insufficiency, chronic atrial fibrillation, obstructive  sleep apnea.  He has had multiple previous orthopedic procedures.   His current medications are metformin 500 mg b.i.d., Benicar 20 mg  daily, metoprolol 25 mg b.i.d., Crestor 10 mg daily, levothyroxine 112  mcg daily, allopurinol 300 mg daily, potassium 20 mEq daily, and  Coumadin.   REVIEW OF SYSTEMS:  He has gained some weight.  He is 5 feet 10, weighs  295.  He does have problems of reflux.  He has been taking diuretics,  which is causing him to urinate frequently.  He has arthritis and joint  pain.  All other systems are negative.   PHYSICAL EXAMINATION:  GENERAL:  The patient is an obese 77 year old  gentleman in no acute distress.  VITAL SIGNS:  Blood pressure 148/82, pulse 69, respirations 18, and his  oxygen saturation is 97% on room air.  CARDIAC:  Normal S1 and S2.  There is an S4.  I did not hear a murmur.  LUNGS:  Diminished but equal breath sounds bilaterally.  There is no  rales or wheezing.   LABORATORY DATA:  His CT of the chest shows aneurysmal ascending aorta,  this measures 4.6 cm in its largest diameter and is stable from his last  exam 2 years ago.  He was also noted to have four-chamber cardiomegaly.   IMPRESSION:  The patient is a 77 year old gentleman, who has been  followed up for a thoracic aortic aneurysm.  He is now 4 years from  first diagnosis and the  aneurysm has been stable over that period of  time, specifically there is no change in the last 2 years from his scan  in 2008.  There is no contraindication to him undergoing any type of  surgery from the standpoint of his ascending thoracic aorta, however, he  should be carefully evaluated from a cardiac standpoint and any other  medical issues should be cleared prior to undertaking elective surgery.  I will plan to see the patient back in a year.  We will repeat a CT scan  at that time.   Salvatore Decent Dorris Fetch, M.D.  Electronically Signed   SCH/MEDQ  D:  10/24/2008  T:  10/25/2008  Job:  [id number]   cc:   Jonelle Sidle, MD  Marisue Ivan, MD

## 2010-09-17 NOTE — Assessment & Plan Note (Signed)
OFFICE VISIT   Darren Parker, Darren Parker  DOB:  1932-11-26                                        October 10, 2009  CHART #:  [id number]   HISTORY:  The patient is a 77 year old gentleman had been following  since 2006, with an enlarged ascending aorta.  He was last seen in June  2010, at which time the aneurysm was stable at 4.6 cm in his largest  diameter.  He states that he has been doing well overall.  He had a knee  replacement last year.  His biggest issue he complains about is his  weight being elevated.  He says ever since he got a hormone shot for his  prostate cancer that he just cannot control his appetite and he has not  been able to exercise on a regular basis.  His weight has gone from  about 285, normally up to 308 pounds.  He recently saw Dr. Adria Dill and  had changes made to his blood pressure medications, but he did not bring  his medications with him, so he is not sure what change was made.  He  has not been having any chest pain or shortness of breath.  He does have  some right shoulder arthritic problems, which she is considering having  surgery on.   His current medications as far as he can report to [place] today are  metformin 500 mg b.i.d., Benicar 20 mg daily, Toprol 25 mg daily,  Crestor 10 mg daily, levothyroxine 112 mcg daily, allopurinol 300 mg  daily, a diuretic, and Flomax.  He also was on Coumadin.  Without his  medications, I am not sure that he is 100% accurate.   ALLERGIES:  He is allergic to sulfa.   PHYSICAL EXAMINATION:  The patient is a markedly overweight 77 year old  gentleman in no acute distress.  His blood pressure was 175/88, pulse  72, respirations are 20, his oxygen saturation is 95% on room air.  Lungs are clear.  His cardiac exam has regular rate and rhythm.  Normal  S1 and S2.  No rubs, murmurs, or gallops.  There is no carotid bruits.  He has well-healed surgical scars on both knees.   CT scan is reviewed.  There has  been no change in the interim since last  year of his 4.6-cm ascending aortic aneurysm.   IMPRESSION:  The patient is a 77 year old gentleman with an ascending  aortic aneurysm about 4.6 cm in diameter.  This was not changed since  2006, so we now have a 5-year course with no change in size.  He is a  rather large man, but this is a true aneurysm but the remainder of his  aorta is more normal sized.  He needs to  be continued to be followed  with CT scans on yearly basis.  I will plan to see him back in 1 year.  He discussed possibly getting his right shoulder operated on.  From my  standpoint that could be done that again should be cleared to Dr.  Antoine Poche in regards to his cardiac status before he undergoes any kind  of general anesthesia.   Salvatore Decent Dorris Fetch, M.D.  Electronically Signed   SCH/MEDQ  D:  10/10/2009  T:  10/11/2009  Job:  [id number]   cc:   Marisue Ivan, MD  Rollene Rotunda, MD, [facility]

## 2010-09-17 NOTE — Assessment & Plan Note (Signed)
[facility]                          [facility] NOTE   VERLIN, UHER                  MRN:          [id number]  DATE:02/21/2008                            DOB:          Mar 16, 1933    PRIMARY CARE PHYSICIAN:  Marisue Ivan MD in [place], [place].   CARDIOTHORACIC SURGEON:  Salvatore Decent. Dorris Fetch, MD   ONCOLOGIST:  Lebron Conners. Darovsky, MD   REASON FOR VISIT:  Routine followup.   HISTORY OF PRESENT ILLNESS:  I saw Mr. Manuele in April 2008.  He  has a history of permanent atrial fibrillation managed with a strategy  of heart rate control and anticoagulation.  He denies any significant  palpitations or chest pain and his electrocardiogram today shows atrial  fibrillation at 71 beats per minute with a leftward axis and incomplete  right bundle branch block pattern which is old.  He has nonspecific ST-T  wave changes as well.  He has been tolerating Coumadin without any major  bleeding problems.  His last INR was supratherapeutic at 4.1 back in  late September with adjustments made at that time.   ALLERGIES:  SULFA drugs.   CURRENT MEDICATIONS:  The patient did not know his medicines today.  We  got information from his pharmacy.  He is on Coumadin as directed by our  C[facility]madin Clinic, ketoconazole 2% cream, doxycycline 300 mg as directed,  ciprofloxacin 500 mg as directed, metformin 500 mg p.o. b.i.d.,  carvedilol 6.25 mg p.o. b.i.d., and pravastatin 40 mg p.o. daily.   REVIEW OF SYSTEMS:  As outlined above.  Notably, Mr. Bollig is also  followed for mild thoracic aortic dilatation.  I found CT scan of the  chest from April 2008 done in [place] demonstrating an descending  thoracic aortic diameter of 4.4 x 4.7 cm which was stable.  This has  been followed by Dr. Dorris Fetch.  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  This is followed most closely by Dr. Ubaldo Glassing.  Otherwise,  he is having some right knee pain due to arthritis and had a flare of  gout in his left knee.  Otherwise, negative.   PHYSICAL EXAMINATION:  VITAL SIGNS:  Blood pressure is 122/80, heart  rate is 68 and irregular, weight is 294 pounds.  GENERAL:  This is a morbidly obese male in no acute distress.  NECK:  No elevated jugular venous pressure with increased girth.  No  obvious bruits.  LUNGS:  Clear.  CARDIAC:  Reveals an irregularly irregular rhythm.  No S3 gallop.  No  loud systolic murmur.  EXTREMITIES:  Exhibit chronic-appearing edema, some venostasis.   IMPRESSION AND RECOMMENDATIONS:  Permanent atrial fibrillation with no  significant palpitations.  We will continue a strategy of heart rate  control and anticoagulation.  He will stay on present dose of Coreg and  Coumadin as followed by the Coumadin Clinic.  Follow up will be on an  annual basis.  Jonelle Sidle, MD  Electronically Signed    SGM/MedQ  DD: 02/21/2008  DT: 02/22/2008  Job #: [id number]   cc:   Marisue Ivan, MD  Salvatore Decent Dorris Fetch, M.D.  Boris M. Darovsky, M.D.

## 2010-09-18 ENCOUNTER — Other Ambulatory Visit: Payer: Self-pay | Admitting: *Deleted

## 2010-09-18 MED ORDER — CARVEDILOL 6.25 MG PO TABS: 6.2500 mg | ORAL_TABLET | Freq: Two times a day (BID) | ORAL | Status: DC

## 2010-09-20 NOTE — Cardiovascular Report (Signed)
Darren Parker, Darren Parker           ACCOUNT NO.:  [id number]   MEDICAL RECORD NO.:  [id number]          PATIENT TYPE:  OIB   LOCATION:  6501                         FACILITY:  [facility]   PHYSICIAN:  Vida Roller, M.D.   DATE OF BIRTH:  07/11/1932   DATE OF PROCEDURE:  DATE OF DISCHARGE:                              CARDIAC CATHETERIZATION   REFERRING CARDIOLOGIST:  Dr. Simona Huh.   REFERRING PHYSICIAN:  Dr. Kinnie Scales Plomaritis   HISTORY OF PRESENT ILLNESS:  This is a 77 year old man who has obesity,  obstructive sleep apnea, who is pending a right knee replacement for  arthritic disease who had a myocardial perfusion imaging study which showed  depressed LV function and two vessel coronary disease in both his inferior  and anterior walls and this is an assessment for ischemia preoperatively.   DETAILS OF THE PROCEDURE:  After obtaining informed consent, the patient was  brought to the cardiac catheterization laboratory in the fasting state.  He  was prepped and draped in the usual sterile manner and local anesthetic was  obtained over the right groin using 1% lidocaine without epinephrine.  The  right femoral artery was cannulated using the modified Seldinger technique  with a 4 French 10 cm sheath.  Unfortunately, he had severe tortuosity of  the aorta and required up sizing of his femoral artery sheath, eventually  from a 5 French 23 mm sheath to a 6 French 30 cm sheath for adequate access  to his coronary arteries.  His left coronary artery was cannulated  eventually after multiple tries with various catheters with an EBU 6 [place]  guide.  His right coronary artery was cannulated using an AL1 5 [place]. A 5  French pigtail catheter was used for aortography with imaging of the aortic  route.  At the conclusion of the procedure, the catheters were removed.  The  femoral artery sheath was removed.  Hemostasis was obtained using direct  manual pressure at the conclusion of the hole.   There was no evidence of  ecchymosis or hematoma formation.  Distal pulses were intact.  Total  fluoroscopic time was 14-1/2 minutes.  Total iodine contrast was 200 cc.   RESULTS:  Aortic pressure 120/85, mean arterial pressure of 101 mmHg.   CORONARY ANGIOGRAPHY:  The left main coronary artery is a large vessel with  no angiographic disease.   The left anterior descending coronary artery is a large transapical vessel  with luminal irregularities in its distal course.   The circumflex coronary artery is a moderate caliber vessel and is a  dominant vessel giving rise to two obtuse marginals, two posterolateral  branches and a small posterior descending coronary artery.  The circumflex  coronary artery is free of disease.   Please note that there is one large diagonal branch which branches very high  in the course of the LAD and is also free of disease.   The right coronary artery is a small nondominant vessel which is  angiographically free of disease.   The aorta was seen imaged in the 30 degree LAO, 30 degree caudale view and  appears to  have significant dilatation at the root and the proximal portion  of the aorta.  Due to the size of the aorta, it was difficult to adequately  image it, even with a power injection of 50 cc of dye.   Left ventriculography was not performed.   ASSESSMENT:  1.  Angiographically normal coronary arteries which are left dominant.  2.  Aortic aneurysm of the ascending aorta.  3.  False positive myocardial perfusion imaging study.   RECOMMENDATIONS:  1.  My recommendations, then, are that the patient have his aorta further      imaged.  I would recommend a CT angiogram.  2.  He will need assessment of his left ventricular systolic function with a      more accurate study than the myocardial perfusion imaging study.  I      would recommend, due to his body habitus, that an echocardiogram may not      be the best imaging study for him, and that he  may benefit from a MUGA      scan.      JH/MEDQ  D:  06/27/2004  T:  06/27/2004  Job:  [id number]

## 2010-09-20 NOTE — Assessment & Plan Note (Signed)
[facility]                          [facility] OFFICE NOTE   [name], [name]                  MRN:          [id number]  DATE:08/12/2006                            DOB:          [birth year]    REASON FOR VISIT:  Routine cardiac followup.   HISTORY OF PRESENT ILLNESS:  Mr. Darren Parker has not been seen in the  office since March of 2006.  He has a history of prominent atrial  fibrillation on Coumadin, as well as obstructive sleep apnea on CPAP  associated with morbid obesity, chronic venous insufficiency, and also  documentation of a descending aortic aneurysm by CT scan of the chest.  He has undergone prior cardiac catheterization revealing normal coronary  arteries, and reports followup with Dr. Dorris Parker ([facility]) with a recent  CT scan of the chest by the patient's account showing stable findings.  He denies having any significant palpitations or chest pain.  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  He is undergoing chemotherapy through the  Cancer Center here in [place].  He also has apparently had some prostate  problems.  His electrocardiogram today shows atrial fibrillation with an  incomplete right bundle branch block pattern at 75 beats per minute,  associated with nonspecific ST-T wave changes.  He reports having  general elevation in his blood pressure over the last several weeks and  also peripheral edema.  He has had no orthopnea or PND.   ALLERGIES:  SULFA DRUGS.   PRESENT MEDICATIONS:  1. Benicar 20 mg p.o. daily.  2. Metoprolol 25 mg p.o. b.i.d.  3. Endocrine 25 mg 2 to 4 tablets p.o. daily.  4. Crestor 10 mg p.o. daily.  5. Levothyroxine 0.122 mg p.o. daily.  6. Allopurinol 300 mg p.o. daily.  7. Potassium chloride 20 mEq p.o. b.i.d.  8. Iron supplements.  9. Coumadin as directed by the Coumadin Clinic.  10.Casodex 50 mg p.o. daily.   REVIEW OF  SYSTEMS:  As described in the history of present illness.   EXAMINATION:  Blood pressure is 158/91, heart rate is 83 and irregular,  weight is 303 pounds.  This is a morbidly obese male in no acute distress.  Denying any active  symptoms.  HEENT:  Conjunctivae look normal, oropharynx clear.  NECK:  Supple without elevated jugular venous pressure or loud bruits.  No thyromegaly was noted.  LUNGS:  Diminished breath sounds.  No wheezing or rhonchi.  CARDIAC:  Irregularly irregular rhythm without loud murmur or S3 gallop.  ABDOMEN:  Obese.  EXTREMITIES:  Exhibit 2+ edema below the knees.  Fairly firm and chronic-  appearing.  Venous stasis ulcer noted.  Distal pulses are 1+.  SKIN:  Warm and dry otherwise.  MUSCULOSKELETAL:  No kyphosis is noted.  NEURO/PSYCHIATRIC:  The patient is alert and oriented x3.   IMPRESSION/RECOMMENDATIONS:  1. Permanent atrial fibrillation, rate controlled, and on Coumadin as      followed through the Coumadin clinic.  2. Hypertension and increased lower extremity edema over the last  several weeks.  We will plan a followup echocardiogram to reassess      left ventricular function.  In the past, this has been within      normal limits.  He does report diagnosis of some type of recent      small cell tumor and is undergoing chemotherapy (details not      known).  3. Known ascending aortic aneurysm.  Followed by Dr. Dorris Parker with      cardiovascular and thoracic surgery in [place].  The patient      reports a recent CT scan of the chest showing stable finding.  4. We will inform the patient of his echocardiogram results and plan a      followup visit over the next 6 months.     Jonelle Sidle, MD  Electronically Signed    SGM/MedQ  DD: 08/12/2006  DT: 08/12/2006  Job #: [id number]

## 2010-09-27 ENCOUNTER — Other Ambulatory Visit: Payer: Self-pay | Admitting: *Deleted

## 2010-09-27 MED ORDER — AMLODIPINE BESYLATE 5 MG PO TABS: 5.0000 mg | ORAL_TABLET | Freq: Every day | ORAL | Status: DC

## 2010-10-15 ENCOUNTER — Encounter: Payer: Medicare Other | Admitting: *Deleted

## 2010-10-17 ENCOUNTER — Encounter (INDEPENDENT_AMBULATORY_CARE_PROVIDER_SITE_OTHER): Payer: Medicare Other | Admitting: Thoracic Surgery (Cardiothoracic Vascular Surgery)

## 2010-10-17 ENCOUNTER — Ambulatory Visit
Admission: RE | Admit: 2010-10-17 | Discharge: 2010-10-17 | Disposition: A | Discharge status: Home / Self Care | Payer: Medicare Other | Source: Ambulatory Visit | Attending: Thoracic Surgery (Cardiothoracic Vascular Surgery) | Admitting: Thoracic Surgery (Cardiothoracic Vascular Surgery)

## 2010-10-17 DIAGNOSIS — C61 Malignant neoplasm of prostate: Secondary | ICD-10-CM

## 2010-10-17 DIAGNOSIS — C8590 Non-Hodgkin lymphoma, unspecified, unspecified site: Secondary | ICD-10-CM

## 2010-10-17 DIAGNOSIS — I712 Thoracic aortic aneurysm, without rupture: Secondary | ICD-10-CM

## 2010-10-17 MED ORDER — IOHEXOL 300 MG/ML  SOLN
100.0000 mL | Freq: Once | INTRAMUSCULAR | Status: AC | PRN
  Administered 2010-10-17: 100 mL via INTRAVENOUS

## 2010-10-17 MED ORDER — IOHEXOL 300 MG/ML  SOLN
25.0000 mL | Freq: Once | INTRAMUSCULAR | Status: AC | PRN
  Administered 2010-10-17: 25 mL via INTRAVENOUS

## 2010-10-18 NOTE — Assessment & Plan Note (Signed)
OFFICE VISIT  Darren Parker, Darren Parker DOB:  1933-04-08                                        October 17, 2010 CHART #:  [id number]  HISTORY OF PRESENT ILLNESS:  The patient is a 77 year old gentleman I have been following for an ascending aortic aneurysm, this dates back to 2006.  He is now here for his annual follow-up.  When he was seen a year ago, the aneurysm was about 4.6 cm and unchanged from the previous exam. He states he has been doing well.  He has been having some shoulder problems.  He has lost weight.  He has gotten up he says almost 50 pounds but he says he has lost more than 10 pounds since his last visit. His weight is 298 today.  He was 308 last year.  He has not been having any chest pain, shortness of breath, and overall feels well.  CURRENT MEDICATIONS: 1. Metformin 500 mg b.i.d. 2. Levothyroxine 112 mcg daily. 3. Allopurinol 300 mg daily. 4. Potassium 20 mEq b.i.d. 5. Coumadin. 6. Flomax 0.4 mg at bedtime. 7. Amlodipine 5 mg daily. 8. Coreg 6.25 mg b.i.d. 9. Pravastatin 40 mg daily. 10.Triamterene/hydrochlorothiazide 37.5/25 one half tablet daily. 11.Citrucel 1 tablet b.i.d. 12.He uses hydrocodone for back pain.  ALLERGIES:  He has an allergy to sulfa.  PHYSICAL EXAMINATION:  General:  The patient is a 77 year old obese gentleman in no acute distress.  Neurologic:  Alert, oriented x3 with no deficits.  Neck:  Supple without thyromegaly or bruits.  Cardiac: Regular rate and rhythm.  Normal S1-S2.  No rubs, murmurs or gallops. Lungs:  Clear with equal breath sounds bilaterally.  LABORATORY DATA:  His BUN was 14, creatinine 0.7, SGOT was mildly elevated as well as SGPT at 88 and 92 respectively.  Bilirubin, alk phos were normal.  His hematocrit is 35, platelets 126.  A CT of the chest shows stable moderate cardiomegaly.  There was a stable fusiform aortic aneurysm with a diameter of 4.4 cm.  There were some incidental gallstones  noted as well.  IMPRESSION:  The patient is a 77 year old gentleman with a stable ascending aortic aneurysm that has been in this way for 6 years.  He does not have any indications for surgery at the present time.  He will continue to be followed on annual basis.  I think to avoid cumulative radiation dosing for repeat CTs we will try to go with MRIs at his next visit.  If he tolerates that, we will do MRIs thereafter.  Salvatore Decent Dorris Fetch, M.D. Electronically Signed  SCH/MEDQ  D:  10/17/2010  T:  10/18/2010  Job:  [id number]  cc:   Jonelle Sidle, MD Marisue Ivan, MD

## 2010-11-11 ENCOUNTER — Other Ambulatory Visit: Payer: Self-pay | Admitting: *Deleted

## 2010-11-11 MED ORDER — WARFARIN SODIUM 5 MG PO TABS: 5.0000 mg | ORAL_TABLET | ORAL | Status: DC

## 2010-11-14 ENCOUNTER — Other Ambulatory Visit: Payer: Self-pay | Admitting: *Deleted

## 2010-11-14 MED ORDER — WARFARIN SODIUM 5 MG PO TABS: 5.0000 mg | ORAL_TABLET | ORAL | Status: DC

## 2011-01-08 ENCOUNTER — Other Ambulatory Visit: Payer: Self-pay | Admitting: *Deleted

## 2011-01-08 MED ORDER — WARFARIN SODIUM 5 MG PO TABS: 5.0000 mg | ORAL_TABLET | ORAL | Status: DC

## 2011-02-18 ENCOUNTER — Ambulatory Visit (INDEPENDENT_AMBULATORY_CARE_PROVIDER_SITE_OTHER): Payer: Medicare Other | Admitting: *Deleted

## 2011-02-18 DIAGNOSIS — Z7901 Long term (current) use of anticoagulants: Secondary | ICD-10-CM

## 2011-02-18 DIAGNOSIS — I4891 Unspecified atrial fibrillation: Secondary | ICD-10-CM

## 2011-03-04 ENCOUNTER — Other Ambulatory Visit: Payer: Self-pay | Admitting: Internal Medicine

## 2011-03-18 ENCOUNTER — Ambulatory Visit (INDEPENDENT_AMBULATORY_CARE_PROVIDER_SITE_OTHER): Payer: Medicare Other | Admitting: *Deleted

## 2011-03-18 DIAGNOSIS — Z7901 Long term (current) use of anticoagulants: Secondary | ICD-10-CM

## 2011-03-18 DIAGNOSIS — I4891 Unspecified atrial fibrillation: Secondary | ICD-10-CM

## 2011-03-18 MED ORDER — WARFARIN SODIUM 5 MG PO TABS: 5.0000 mg | ORAL_TABLET | ORAL | Status: DC

## 2011-04-15 ENCOUNTER — Ambulatory Visit (INDEPENDENT_AMBULATORY_CARE_PROVIDER_SITE_OTHER): Payer: Medicare Other | Admitting: *Deleted

## 2011-04-15 DIAGNOSIS — Z7901 Long term (current) use of anticoagulants: Secondary | ICD-10-CM

## 2011-04-15 DIAGNOSIS — I4891 Unspecified atrial fibrillation: Secondary | ICD-10-CM

## 2011-04-15 LAB — POCT INR: INR: 3.6

## 2011-05-13 ENCOUNTER — Encounter: Payer: Medicare Other | Admitting: *Deleted

## 2011-05-27 ENCOUNTER — Ambulatory Visit (INDEPENDENT_AMBULATORY_CARE_PROVIDER_SITE_OTHER): Payer: Medicare Other | Admitting: *Deleted

## 2011-05-27 DIAGNOSIS — Z7901 Long term (current) use of anticoagulants: Secondary | ICD-10-CM

## 2011-05-27 DIAGNOSIS — I4891 Unspecified atrial fibrillation: Secondary | ICD-10-CM

## 2011-07-01 ENCOUNTER — Encounter: Payer: Self-pay | Admitting: *Deleted

## 2011-07-08 ENCOUNTER — Ambulatory Visit (INDEPENDENT_AMBULATORY_CARE_PROVIDER_SITE_OTHER): Payer: Medicare Other | Admitting: *Deleted

## 2011-07-08 DIAGNOSIS — I4891 Unspecified atrial fibrillation: Secondary | ICD-10-CM

## 2011-07-08 DIAGNOSIS — Z7901 Long term (current) use of anticoagulants: Secondary | ICD-10-CM

## 2011-08-19 ENCOUNTER — Ambulatory Visit (INDEPENDENT_AMBULATORY_CARE_PROVIDER_SITE_OTHER): Payer: Medicare Other | Admitting: *Deleted

## 2011-08-19 ENCOUNTER — Encounter: Payer: Self-pay | Admitting: Cardiology

## 2011-08-19 ENCOUNTER — Ambulatory Visit (INDEPENDENT_AMBULATORY_CARE_PROVIDER_SITE_OTHER): Payer: Medicare Other | Admitting: Cardiology

## 2011-08-19 VITALS — BP 135/70 | HR 62 | Ht 70.0 in | Wt 312.1 lb

## 2011-08-19 DIAGNOSIS — Z7901 Long term (current) use of anticoagulants: Secondary | ICD-10-CM

## 2011-08-19 DIAGNOSIS — I10 Essential (primary) hypertension: Secondary | ICD-10-CM

## 2011-08-19 DIAGNOSIS — I4891 Unspecified atrial fibrillation: Secondary | ICD-10-CM

## 2011-08-19 DIAGNOSIS — I719 Aortic aneurysm of unspecified site, without rupture: Secondary | ICD-10-CM

## 2011-08-19 NOTE — Progress Notes (Signed)
Clinical Summary Darren Parker is a 78 y.o.male presenting for followup. He was seen in April of last year. He reports no palpitations, no major bleeding problems on Coumadin. Indicates stable medications since last year. He has followup with his primary care physician every 3 months.  Follow up CT angiogram of the chest back and June of last year showed a stable fusiform ascending aorta measuring 44 mm that has been followed by Dr. Dorris Fetch.  He reports no unusual chest pain or breathlessness. Still struggles with obesity. We discussed his diet some today. He still works as a Printmaker.   Allergies  Allergen Reactions  . Sulfa Drugs Cross Reactors     Current Outpatient Prescriptions  Medication Sig Dispense Refill  . allopurinol (ZYLOPRIM) 300 MG tablet Take 300 mg by mouth daily.       . calcium-vitamin D (OSCAL) 250-125 MG-UNIT per tablet Take 1 tablet by mouth 2 (two) times daily.        . carvedilol (COREG) 6.25 MG tablet Take 1 tablet (6.25 mg total) by mouth 2 (two) times daily with a meal.  180 tablet  3  . Cholecalciferol (VITAMIN D) 1000 UNITS capsule Take 1,000 Units by mouth daily.        Marland Kitchen doxazosin (CARDURA) 4 MG tablet Take 4 mg by mouth at bedtime.       Marland Kitchen levothyroxine (SYNTHROID, LEVOTHROID) 112 MCG tablet Take 112 mcg by mouth daily.       . metFORMIN (GLUCOPHAGE) 500 MG tablet Take 500 mg by mouth 2 (two) times daily with a meal. 2 tabs bid      . potassium chloride SA (K-DUR,KLOR-CON) 20 MEQ tablet Take 20 mEq by mouth 2 (two) times daily.       . pravastatin (PRAVACHOL) 40 MG tablet Take 40 mg by mouth daily.        Marland Kitchen triamterene-hydrochlorothiazide (MAXZIDE-25) 37.5-25 MG per tablet Take 1 tablet by mouth daily.        Marland Kitchen warfarin (COUMADIN) 5 MG tablet Take 1 tablet (5 mg total) by mouth as directed. Take coumadin 7.5mg  daily  145 tablet  3  . amLODipine (NORVASC) 5 MG tablet Take 1 tablet (5 mg total) by mouth daily.  90 tablet  3    Past Medical History    Diagnosis Date  . Atrial fibrillation   . Hyperlipidemia   . Hypertension   . Obstructive sleep apnea   . Type 2 diabetes mellitus   . Dilation of thoracic aorta     Ascending - CT scan 10/2008, 4.3 cm proximal   . 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 Prostate cancer   . Thrombocytopenia   . Iron deficiency anemia   . Venous insufficiency     Social History Mr. Spinnato reports that he has never smoked. He has never used smokeless tobacco. Mr. Holleman reports that he does not drink alcohol.  Review of Systems Does have to remove ticks after working outdoors. Negative except as outlined.  Physical Examination Filed Vitals:   08/19/11 1319  BP: 135/70  Pulse: 62    Morbidly obese male, in no acute distress.  HEENT: Conjunctiva and lids normal, oropharynx with moist mucosa.  Neck: Increased girth, no obvious carotid bruits or thyromegaly.  Lungs: Clear to auscultation, nonlabored. Diminished.  Cardiac: Indistinct PMI, irregularly irregular, no S3 gallop.  Abdomen: No hepatomegaly, bowel sounds present, nontender.  Extremities: Chronic venous stasis  and 1+ pitting edema with 1+ pulses.  Skin: Warm and dry. No ecchymosis or petechia.  Musculoskeletal: No gross deformities.  Neuropsychiatric: Alert and oriented x3, affect appropriate.   ECG Atrial fibrillation at 66 with leftward axis, R prime V1-V2, nonspecific ST changes.   Problem List and Plan

## 2011-08-19 NOTE — Patient Instructions (Signed)
Your physician you to follow up in 1 year. You will receive a reminder letter in the mail one-two months in advance. If you don't receive a letter, please call our office to schedule the follow-up appointment. Your physician recommends that you continue on your current medications as directed. Please refer to the Current Medication list given to you today. 

## 2011-08-19 NOTE — Assessment & Plan Note (Signed)
Reasonable blood pressure control today. Continue current regimen.

## 2011-08-19 NOTE — Assessment & Plan Note (Signed)
Fusiform ascending aorta, stable by CT angiogram back in June. Keep routine follow up with Dr. Dorris Fetch.

## 2011-08-19 NOTE — Assessment & Plan Note (Signed)
Symptomatically stable, heart rate well controlled. Continue current therapy including Coumadin. He is followed in the Coumadin clinic.

## 2011-09-05 ENCOUNTER — Ambulatory Visit (INDEPENDENT_AMBULATORY_CARE_PROVIDER_SITE_OTHER): Payer: Medicare Other | Admitting: *Deleted

## 2011-09-05 DIAGNOSIS — Z7901 Long term (current) use of anticoagulants: Secondary | ICD-10-CM

## 2011-09-05 DIAGNOSIS — I4891 Unspecified atrial fibrillation: Secondary | ICD-10-CM

## 2011-09-10 ENCOUNTER — Other Ambulatory Visit: Payer: Self-pay | Admitting: *Deleted

## 2011-09-10 MED ORDER — CARVEDILOL 6.25 MG PO TABS: 6.2500 mg | ORAL_TABLET | Freq: Two times a day (BID) | ORAL | Status: DC

## 2011-09-16 ENCOUNTER — Other Ambulatory Visit: Payer: Self-pay | Admitting: Thoracic Surgery (Cardiothoracic Vascular Surgery)

## 2011-09-16 DIAGNOSIS — I712 Thoracic aortic aneurysm, without rupture, unspecified: Secondary | ICD-10-CM

## 2011-09-22 ENCOUNTER — Encounter: Payer: Medicare Other | Admitting: Hematology and Oncology

## 2011-09-22 DIAGNOSIS — C7A00 Malignant carcinoid tumor of unspecified site: Secondary | ICD-10-CM

## 2011-09-22 DIAGNOSIS — Z452 Encounter for adjustment and management of vascular access device: Secondary | ICD-10-CM
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
## 2011-10-14 ENCOUNTER — Other Ambulatory Visit: Payer: Medicare Other

## 2011-10-14 ENCOUNTER — Encounter: Payer: Medicare Other | Admitting: Thoracic Surgery (Cardiothoracic Vascular Surgery)

## 2011-10-15 ENCOUNTER — Encounter: Payer: Medicare Other | Admitting: Thoracic Surgery (Cardiothoracic Vascular Surgery)

## 2011-10-22 ENCOUNTER — Encounter: Payer: Medicare Other | Admitting: Hematology and Oncology

## 2011-11-04 ENCOUNTER — Encounter: Payer: Medicare Other | Admitting: Thoracic Surgery (Cardiothoracic Vascular Surgery)

## 2011-11-04 ENCOUNTER — Ambulatory Visit
Admission: RE | Admit: 2011-11-04 | Discharge: 2011-11-04 | Disposition: A | Discharge status: Home / Self Care | Payer: Medicare Other | Source: Ambulatory Visit | Attending: Thoracic Surgery (Cardiothoracic Vascular Surgery) | Admitting: Thoracic Surgery (Cardiothoracic Vascular Surgery)

## 2011-11-04 DIAGNOSIS — I712 Thoracic aortic aneurysm, without rupture: Secondary | ICD-10-CM

## 2011-11-04 MED ORDER — GADOBENATE DIMEGLUMINE 529 MG/ML IV SOLN
20.0000 mL | Freq: Once | INTRAVENOUS | Status: AC | PRN
  Administered 2011-11-04: 20 mL via INTRAVENOUS

## 2011-11-11 ENCOUNTER — Encounter: Payer: Medicare Other | Admitting: Hematology and Oncology

## 2011-11-11 DIAGNOSIS — Z452 Encounter for adjustment and management of vascular access device: Secondary | ICD-10-CM

## 2011-11-11 DIAGNOSIS — C7A00 Malignant carcinoid tumor of unspecified site: Secondary | ICD-10-CM

## 2011-12-30 ENCOUNTER — Encounter: Payer: Medicare Other | Admitting: Internal Medicine

## 2011-12-30 DIAGNOSIS — D473 Essential (hemorrhagic) thrombocythemia: Secondary | ICD-10-CM

## 2011-12-30 DIAGNOSIS — C61 Malignant neoplasm of prostate: Secondary | ICD-10-CM

## 2012-02-25 ENCOUNTER — Encounter: Payer: Self-pay | Admitting: *Deleted

## 2012-03-09 ENCOUNTER — Ambulatory Visit (INDEPENDENT_AMBULATORY_CARE_PROVIDER_SITE_OTHER): Payer: Medicare Other | Admitting: *Deleted

## 2012-03-09 DIAGNOSIS — I4891 Unspecified atrial fibrillation: Secondary | ICD-10-CM

## 2012-03-09 DIAGNOSIS — Z7901 Long term (current) use of anticoagulants: Secondary | ICD-10-CM

## 2012-03-30 ENCOUNTER — Other Ambulatory Visit: Payer: Self-pay | Admitting: *Deleted

## 2012-03-30 MED ORDER — WARFARIN SODIUM 5 MG PO TABS: 5.0000 mg | ORAL_TABLET | ORAL | Status: DC

## 2012-04-05 ENCOUNTER — Other Ambulatory Visit: Payer: Self-pay | Admitting: Cardiology

## 2012-04-05 MED ORDER — WARFARIN SODIUM 5 MG PO TABS: 5.0000 mg | ORAL_TABLET | ORAL | Status: DC

## 2012-04-13 ENCOUNTER — Ambulatory Visit (INDEPENDENT_AMBULATORY_CARE_PROVIDER_SITE_OTHER): Payer: Medicare Other | Admitting: *Deleted

## 2012-04-13 DIAGNOSIS — I4891 Unspecified atrial fibrillation: Secondary | ICD-10-CM

## 2012-04-13 DIAGNOSIS — Z7901 Long term (current) use of anticoagulants: Secondary | ICD-10-CM

## 2012-05-25 ENCOUNTER — Ambulatory Visit (INDEPENDENT_AMBULATORY_CARE_PROVIDER_SITE_OTHER): Payer: Medicare Other | Admitting: *Deleted

## 2012-05-25 ENCOUNTER — Other Ambulatory Visit (HOSPITAL_COMMUNITY): Payer: Self-pay | Admitting: Internal Medicine

## 2012-05-25 DIAGNOSIS — I4891 Unspecified atrial fibrillation: Secondary | ICD-10-CM

## 2012-05-25 DIAGNOSIS — M7989 Other specified soft tissue disorders: Secondary | ICD-10-CM

## 2012-05-25 DIAGNOSIS — Z7901 Long term (current) use of anticoagulants: Secondary | ICD-10-CM

## 2012-05-25 LAB — POCT INR: INR: 2.1

## 2012-05-25 NOTE — Patient Instructions (Signed)
Biopsy has not been scheduled yet.  Pt understands coumadin instructions for coming off coumadin and will resume night of procedure.  Pt will call for INR check 10 days after restarting coumadin.

## 2012-05-28 ENCOUNTER — Other Ambulatory Visit (HOSPITAL_COMMUNITY): Payer: Self-pay | Admitting: Physician Assistant

## 2012-05-28 ENCOUNTER — Other Ambulatory Visit: Payer: Self-pay | Admitting: Radiology

## 2012-06-01 ENCOUNTER — Encounter (HOSPITAL_COMMUNITY): Payer: Self-pay

## 2012-06-01 ENCOUNTER — Ambulatory Visit (HOSPITAL_COMMUNITY)
Admission: RE | Admit: 2012-06-01 | Discharge: 2012-06-01 | Disposition: A | Discharge status: Home / Self Care | Payer: Medicare Other | Source: Ambulatory Visit | Attending: Internal Medicine | Admitting: Internal Medicine

## 2012-06-01 DIAGNOSIS — M799 Soft tissue disorder, unspecified: Secondary | ICD-10-CM | POA: Insufficient documentation

## 2012-06-01 DIAGNOSIS — Z8546 Personal history of malignant neoplasm of prostate: Secondary | ICD-10-CM | POA: Insufficient documentation

## 2012-06-01 DIAGNOSIS — M7989 Other specified soft tissue disorders: Secondary | ICD-10-CM

## 2012-06-01 DIAGNOSIS — C969 Malignant neoplasm of lymphoid, hematopoietic and related tissue, unspecified: Secondary | ICD-10-CM | POA: Insufficient documentation

## 2012-06-01 DIAGNOSIS — Z859 Personal history of malignant neoplasm, unspecified: Secondary | ICD-10-CM | POA: Insufficient documentation

## 2012-06-01 DIAGNOSIS — E785 Hyperlipidemia, unspecified: Secondary | ICD-10-CM | POA: Insufficient documentation

## 2012-06-01 DIAGNOSIS — E119 Type 2 diabetes mellitus without complications: Secondary | ICD-10-CM | POA: Insufficient documentation

## 2012-06-01 DIAGNOSIS — G4733 Obstructive sleep apnea (adult) (pediatric): Secondary | ICD-10-CM | POA: Insufficient documentation

## 2012-06-01 DIAGNOSIS — I10 Essential (primary) hypertension: Secondary | ICD-10-CM | POA: Insufficient documentation

## 2012-06-01 LAB — CBC
HCT: 37.3 % — ABNORMAL LOW (ref 39.0–52.0)
Hemoglobin: 12.3 g/dL — ABNORMAL LOW (ref 13.0–17.0)
MCH: 29.4 pg (ref 26.0–34.0)
RBC: 4.19 MIL/uL — ABNORMAL LOW (ref 4.22–5.81)

## 2012-06-01 LAB — PROTIME-INR: Prothrombin Time: 13.7 seconds (ref 11.6–15.2)

## 2012-06-01 MED ORDER — FENTANYL CITRATE 0.05 MG/ML IJ SOLN
INTRAMUSCULAR | Status: AC | PRN
  Administered 2012-06-01 (×2): 50 ug via INTRAVENOUS

## 2012-06-01 MED ORDER — SODIUM CHLORIDE 0.9 % IV SOLN: Freq: Once | INTRAVENOUS | Status: DC

## 2012-06-01 MED ORDER — MIDAZOLAM HCL 2 MG/2ML IJ SOLN
INTRAMUSCULAR | Status: AC
  Filled 2012-06-01: qty 4

## 2012-06-01 MED ORDER — HYDROCODONE-ACETAMINOPHEN 5-325 MG PO TABS: 1.0000 | ORAL_TABLET | ORAL | Status: DC | PRN

## 2012-06-01 MED ORDER — FENTANYL CITRATE 0.05 MG/ML IJ SOLN
INTRAMUSCULAR | Status: AC
  Filled 2012-06-01: qty 4

## 2012-06-01 MED ORDER — MIDAZOLAM HCL 2 MG/2ML IJ SOLN
INTRAMUSCULAR | Status: AC | PRN
  Administered 2012-06-01: 2 mg via INTRAVENOUS

## 2012-06-01 NOTE — Procedures (Signed)
CT core paracaval mass 18g x4 No complication No blood loss. See complete dictation in [facility].

## 2012-06-01 NOTE — H&P (Signed)
Darren Parker is an 79 y.o. male.   Chief Complaint: "I'm here for a biopsy" HPI: Patient with history of small cell neuroendocrine carcinoma, prostate cancer and recent CT revealing a retrocaval soft tissue mass. He presents today for CT guided biopsy of the retrocaval mass.  Past Medical History  Diagnosis Date  . Campath-induced atrial fibrillation   . Hyperlipidemia   . Hypertension   . Obstructive sleep apnea   . Type 2 diabetes mellitus   . Dilation of thoracic aorta     Ascending - CT scan 10/2008, 4.3 cm proximal   . 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 Prostate cancer   . Thrombocytopenia   . Iron deficiency anemia   . Venous insufficiency     Past Surgical History  Procedure Date  . Right total knee replacement 2010    No family history on file. Social History:  reports that he has never smoked. He has never used smokeless tobacco. He reports that he does not drink alcohol or use illicit drugs.  Allergies:  Allergies  Allergen Reactions  . Sulfa Drugs Cross Reactors Other (See Comments)    Does not remember reaction; was "years and years ago."    Current outpatient prescriptions:allopurinol (ZYLOPRIM) 300 MG tablet, Take 300 mg by mouth daily. , Disp: , Rfl: ;  amLODipine (NORVASC) 5 MG tablet, Take 1 tablet (5 mg total) by mouth daily., Disp: 90 tablet, Rfl: 3;  calcium-vitamin D (OSCAL) 250-125 MG-UNIT per tablet, Take 1 tablet by mouth 2 (two) times daily.  , Disp: , Rfl:  carvedilol (COREG) 6.25 MG tablet, Take 1 tablet (6.25 mg total) by mouth 2 (two) times daily with a meal., Disp: 180 tablet, Rfl: 3;  Cholecalciferol (VITAMIN D) 1000 UNITS capsule, Take 1,000 Units by mouth daily.  , Disp: , Rfl: ;  doxazosin (CARDURA) 4 MG tablet, Take 4 mg by mouth at bedtime. , Disp: , Rfl: ;  HYDROcodone-acetaminophen (NORCO) 10-325 MG per tablet, Take 1-2 tablets by mouth every 6 (six) hours as needed. For  pain, Disp: , Rfl:  levothyroxine (SYNTHROID, LEVOTHROID) 112 MCG tablet, Take 112 mcg by mouth daily. , Disp: , Rfl: ;  metFORMIN (GLUCOPHAGE) 500 MG tablet, Take 500 mg by mouth 2 (two) times daily with a meal. 2 tabs bid, Disp: , Rfl: ;  potassium chloride SA (K-DUR,KLOR-CON) 20 MEQ tablet, Take 20 mEq by mouth 2 (two) times daily. , Disp: , Rfl: ;  pravastatin (PRAVACHOL) 40 MG tablet, Take 40 mg by mouth daily.  , Disp: , Rfl:  triamcinolone cream (KENALOG) 0.1 %, Apply 1 application topically 2 (two) times daily., Disp: , Rfl: ;  triamterene-hydrochlorothiazide (MAXZIDE-25) 37.5-25 MG per tablet, Take 1 tablet by mouth daily.  , Disp: , Rfl: ;  warfarin (COUMADIN) 5 MG tablet, Take 5-7.5 mg by mouth daily. Takes 7.5 mg on Monday and Thursday and 5 mg all other days, Disp: , Rfl:  Current facility-administered medications:0.9 %  sodium chloride infusion, , Intravenous, Once, Brayton El, PA   Results for orders placed during the hospital encounter of 06/01/12 (from the past 48 hour(s))  APTT     Status: Normal   Collection Time   06/01/12  9:28 AM      Component Value Range Comment   aPTT 28  24 - 37 seconds   CBC     Status: Abnormal   Collection Time   06/01/12  9:28 AM      Component Value Range Comment   WBC 4.7  4.0 - 10.5 K/uL    RBC 4.19 (*) 4.22 - 5.81 MIL/uL    Hemoglobin 12.3 (*) 13.0 - 17.0 g/dL    HCT 16.1 (*) 09.6 - 52.0 %    MCV 89.0  78.0 - 100.0 fL    MCH 29.4  26.0 - 34.0 pg    MCHC 33.0  30.0 - 36.0 g/dL    RDW 04.5 (*) 40.9 - 15.5 %    Platelets 123 (*) 150 - 400 K/uL   PROTIME-INR     Status: Normal   Collection Time   06/01/12  9:28 AM      Component Value Range Comment   Prothrombin Time 13.7  11.6 - 15.2 seconds    INR 1.06  0.00 - 1.49   GLUCOSE, CAPILLARY     Status: Abnormal   Collection Time   06/01/12  9:34 AM      Component Value Range Comment   Glucose-Capillary 124 (*) 70 - 99 mg/dL    No results found.  Review of Systems  Constitutional:  Negative for fever and chills.  Respiratory: Negative for shortness of breath.        Occ cough  Cardiovascular: Negative for chest pain.  Gastrointestinal: Negative for nausea, vomiting and abdominal pain.  Musculoskeletal: Positive for back pain.  Neurological: Negative for headaches.    Blood pressure 162/91, pulse 66, temperature 98 F (36.7 C), temperature source Oral, resp. rate 16, height 5\' 9"  (1.753 m), weight 300 lb (136.079 kg), SpO2 97.00%. Physical Exam  Constitutional: He is oriented to person, place, and time. He appears well-developed and well-nourished.  Cardiovascular: Normal rate and regular rhythm.   Respiratory: Effort normal and breath sounds normal.  GI: Soft. Bowel sounds are normal.       obese  Musculoskeletal: Normal range of motion. He exhibits edema.  Neurological: He is alert and oriented to person, place, and time.     Assessment/Plan Pt with hx of small cell neuroendocrine carcinoma, prostate cancer and retrocaval soft tissue mass noted on recent CT. Plan is for CT guided biopsy of the retrocaval mass today. Details/risks of procedure d/w pt/wife with their understanding and consent.  Salimah Martinovich,D KEVIN 06/01/2012, 10:45 AM

## 2012-06-01 NOTE — ED Notes (Signed)
Short stay notified for bed 

## 2012-06-02 ENCOUNTER — Other Ambulatory Visit: Payer: Self-pay | Admitting: *Deleted

## 2012-06-02 MED ORDER — WARFARIN SODIUM 5 MG PO TABS: 5.0000 mg | ORAL_TABLET | Freq: Every day | ORAL | Status: DC

## 2012-06-08 ENCOUNTER — Other Ambulatory Visit (HOSPITAL_COMMUNITY): Payer: Self-pay | Admitting: Internal Medicine

## 2012-06-08 ENCOUNTER — Encounter: Payer: Medicare Other | Admitting: Internal Medicine

## 2012-06-08 DIAGNOSIS — C61 Malignant neoplasm of prostate: Secondary | ICD-10-CM

## 2012-06-08 DIAGNOSIS — R1900 Intra-abdominal and pelvic swelling, mass and lump, unspecified site: Secondary | ICD-10-CM

## 2012-06-08 DIAGNOSIS — E119 Type 2 diabetes mellitus without complications: Secondary | ICD-10-CM

## 2012-06-09 ENCOUNTER — Other Ambulatory Visit (HOSPITAL_COMMUNITY): Payer: Self-pay | Admitting: Internal Medicine

## 2012-06-14 ENCOUNTER — Ambulatory Visit (HOSPITAL_COMMUNITY)
Admission: RE | Admit: 2012-06-14 | Discharge: 2012-06-14 | Disposition: A | Discharge status: Home / Self Care | Payer: Medicare Other | Source: Ambulatory Visit | Attending: Internal Medicine | Admitting: Internal Medicine

## 2012-06-14 DIAGNOSIS — R1900 Intra-abdominal and pelvic swelling, mass and lump, unspecified site: Secondary | ICD-10-CM

## 2012-06-14 DIAGNOSIS — I517 Cardiomegaly: Secondary | ICD-10-CM | POA: Insufficient documentation

## 2012-06-14 DIAGNOSIS — M799 Soft tissue disorder, unspecified: Secondary | ICD-10-CM | POA: Insufficient documentation

## 2012-06-14 DIAGNOSIS — R1909 Other intra-abdominal and pelvic swelling, mass and lump: Secondary | ICD-10-CM | POA: Insufficient documentation

## 2012-06-14 DIAGNOSIS — K8020 Calculus of gallbladder without cholecystitis without obstruction: Secondary | ICD-10-CM | POA: Insufficient documentation

## 2012-06-14 DIAGNOSIS — C786 Secondary malignant neoplasm of retroperitoneum and peritoneum: Secondary | ICD-10-CM | POA: Insufficient documentation

## 2012-06-14 DIAGNOSIS — I700 Atherosclerosis of aorta: Secondary | ICD-10-CM | POA: Insufficient documentation

## 2012-06-14 DIAGNOSIS — I2510 Atherosclerotic heart disease of native coronary artery without angina pectoris: Secondary | ICD-10-CM | POA: Insufficient documentation

## 2012-06-14 DIAGNOSIS — R599 Enlarged lymph nodes, unspecified: Secondary | ICD-10-CM | POA: Insufficient documentation

## 2012-06-14 LAB — GLUCOSE, CAPILLARY: Glucose-Capillary: 111 mg/dL — ABNORMAL HIGH (ref 70–99)

## 2012-06-14 MED ORDER — FLUDEOXYGLUCOSE F - 18 (FDG) INJECTION
17.8000 | Freq: Once | INTRAVENOUS | Status: AC | PRN
  Administered 2012-06-14: 17.8 via INTRAVENOUS

## 2012-06-22 ENCOUNTER — Ambulatory Visit (INDEPENDENT_AMBULATORY_CARE_PROVIDER_SITE_OTHER): Payer: Medicare Other | Admitting: *Deleted

## 2012-06-22 DIAGNOSIS — Z7901 Long term (current) use of anticoagulants: Secondary | ICD-10-CM

## 2012-06-22 DIAGNOSIS — I4891 Unspecified atrial fibrillation: Secondary | ICD-10-CM

## 2012-06-22 LAB — POCT INR: INR: 2.2

## 2012-06-24 ENCOUNTER — Encounter: Payer: Medicare Other | Admitting: Internal Medicine

## 2012-06-24 DIAGNOSIS — E119 Type 2 diabetes mellitus without complications: Secondary | ICD-10-CM

## 2012-06-24 DIAGNOSIS — C61 Malignant neoplasm of prostate: Secondary | ICD-10-CM

## 2012-07-06 DIAGNOSIS — C61 Malignant neoplasm of prostate: Secondary | ICD-10-CM

## 2012-07-06 DIAGNOSIS — Z5111 Encounter for antineoplastic chemotherapy: Secondary | ICD-10-CM

## 2012-07-20 ENCOUNTER — Ambulatory Visit (INDEPENDENT_AMBULATORY_CARE_PROVIDER_SITE_OTHER): Payer: Medicare Other | Admitting: *Deleted

## 2012-07-20 DIAGNOSIS — Z452 Encounter for adjustment and management of vascular access device: Secondary | ICD-10-CM

## 2012-07-20 DIAGNOSIS — I4891 Unspecified atrial fibrillation: Secondary | ICD-10-CM

## 2012-07-20 DIAGNOSIS — Z7901 Long term (current) use of anticoagulants: Secondary | ICD-10-CM

## 2012-07-20 DIAGNOSIS — C61 Malignant neoplasm of prostate: Secondary | ICD-10-CM

## 2012-08-03 DIAGNOSIS — Z5111 Encounter for antineoplastic chemotherapy: Secondary | ICD-10-CM

## 2012-08-03 DIAGNOSIS — C61 Malignant neoplasm of prostate: Secondary | ICD-10-CM

## 2012-08-22 ENCOUNTER — Encounter: Payer: Self-pay | Admitting: Cardiology

## 2012-08-24 ENCOUNTER — Encounter: Payer: Self-pay | Admitting: Cardiology

## 2012-08-24 ENCOUNTER — Ambulatory Visit (INDEPENDENT_AMBULATORY_CARE_PROVIDER_SITE_OTHER): Payer: Medicare Other | Admitting: *Deleted

## 2012-08-24 ENCOUNTER — Ambulatory Visit (INDEPENDENT_AMBULATORY_CARE_PROVIDER_SITE_OTHER): Payer: Medicare Other | Admitting: Cardiology

## 2012-08-24 VITALS — BP 156/83 | HR 69 | Ht 69.0 in | Wt 318.1 lb

## 2012-08-24 DIAGNOSIS — E782 Mixed hyperlipidemia: Secondary | ICD-10-CM

## 2012-08-24 DIAGNOSIS — I719 Aortic aneurysm of unspecified site, without rupture: Secondary | ICD-10-CM

## 2012-08-24 DIAGNOSIS — I10 Essential (primary) hypertension: Secondary | ICD-10-CM

## 2012-08-24 DIAGNOSIS — Z7901 Long term (current) use of anticoagulants: Secondary | ICD-10-CM

## 2012-08-24 DIAGNOSIS — I4891 Unspecified atrial fibrillation: Secondary | ICD-10-CM

## 2012-08-24 NOTE — Assessment & Plan Note (Signed)
Most recently 4.6 cm by MRA in July 2013. Keep followup with Dr. Dorris Fetch.

## 2012-08-24 NOTE — Assessment & Plan Note (Signed)
Permanent, continue strategy of heart rate control and anticoagulation. 

## 2012-08-24 NOTE — Assessment & Plan Note (Signed)
He continues on Pravachol, followed by Dr. Adria Dill.

## 2012-08-24 NOTE — Assessment & Plan Note (Signed)
Continue medical therapy, followup with Dr. Adria Dill.

## 2012-08-24 NOTE — Progress Notes (Signed)
Clinical Summary Mr. Darren Parker is a 79 y.o.male last seen in April 2013. Reports no significant palpitations or chest pain. Reports compliance with his medications, no major bleeding problems on Coumadin. INR was therapeutic today.  ECG today shows sinus rhythm with left anterior fascicular block, nonspecific T wave changes.  History includes metastatic prostate cancer with intra-abdominal mass, followed by Dr. Ubaldo Glassing. He is currently on Lupron.  Chest MRA in July 2013 demonstrated ascending aorta maximum diameter 4.6 cm. This has been followed by Dr. Dorris Fetch.  Allergies  Allergen Reactions  . Sulfa Drugs Cross Reactors Other (See Comments)    Does not remember reaction; was "years and years ago."    Current Outpatient Prescriptions  Medication Sig Dispense Refill  . allopurinol (ZYLOPRIM) 300 MG tablet Take 300 mg by mouth daily.       Marland Kitchen amLODipine (NORVASC) 5 MG tablet Take 1 tablet (5 mg total) by mouth daily.  90 tablet  3  . calcium citrate (CALCITRATE - DOSED IN MG ELEMENTAL CALCIUM) 950 MG tablet Take 1 tablet by mouth daily.      . calcium-vitamin D (OSCAL) 250-125 MG-UNIT per tablet Take 1 tablet by mouth 2 (two) times daily.        . carboxymethylcellulose (REFRESH PLUS) 0.5 % SOLN 1 drop as needed.      . carvedilol (COREG) 6.25 MG tablet Take 1 tablet (6.25 mg total) by mouth 2 (two) times daily with a meal.  180 tablet  3  . Cholecalciferol (VITAMIN D) 1000 UNITS capsule Take 1,000 Units by mouth daily.        . Coenzyme Q10 (COQ10) 100 MG CAPS Take 1 capsule by mouth 2 (two) times daily.      Marland Kitchen doxazosin (CARDURA) 4 MG tablet Take 4 mg by mouth at bedtime.       Marland Kitchen HYDROcodone-acetaminophen (NORCO) 10-325 MG per tablet Take 1-2 tablets by mouth every 6 (six) hours as needed. For pain      . ibuprofen (ADVIL,MOTRIN) 200 MG tablet Take 200 mg by mouth as needed for pain.      Marland Kitchen Leuprolide Acetate (LUPRON DEPOT IM) One injection per month, then after May - will  change to every 3 months.      . levothyroxine (SYNTHROID, LEVOTHROID) 112 MCG tablet Take 112 mcg by mouth daily.       . metFORMIN (GLUCOPHAGE) 500 MG tablet Take 500 mg by mouth 2 (two) times daily with a meal. 2 tabs bid      . potassium chloride SA (K-DUR,KLOR-CON) 20 MEQ tablet Take 20 mEq by mouth 2 (two) times daily.       . pravastatin (PRAVACHOL) 40 MG tablet Take 40 mg by mouth daily.        . sennosides-docusate sodium (SENOKOT-S) 8.6-50 MG tablet Take 1 tablet by mouth as needed for constipation.      . triamcinolone cream (KENALOG) 0.1 % Apply 1 application topically 2 (two) times daily.      Marland Kitchen warfarin (COUMADIN) 5 MG tablet Take 1-1.5 tablets (5-7.5 mg total) by mouth daily. Takes 7.5 mg on Monday and Thursday and 5 mg all other days  45 tablet  2   No current facility-administered medications for this visit.    Past Medical History  Diagnosis Date  . Atrial fibrillation   . Hyperlipidemia   . Essential hypertension, benign   . Obstructive sleep apnea   . Type 2 diabetes mellitus   . Dilation of thoracic aorta  Ascending - CT scan 10/2008, 4.3 cm proximal   . 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 Prostate cancer   . Thrombocytopenia   . Iron deficiency anemia   . Venous insufficiency     Social History Mr. Boorman reports that he has never smoked. He has never used smokeless tobacco. Mr. Akkerman reports that he does not drink alcohol.  Review of Systems Negative except as outlined.  Physical Examination Filed Vitals:   08/24/12 1324  BP: 156/83  Pulse: 69   Filed Weights   08/24/12 1324  Weight: 318 lb 1.9 oz (144.298 kg)    Obese male, in no acute distress.  HEENT: Conjunctiva and lids normal, oropharynx with moist mucosa.  Neck: Increased girth, no obvious carotid bruits or thyromegaly.  Lungs: Clear to auscultation, nonlabored. Diminished.  Cardiac: Indistinct PMI, irregularly  irregular, no S3 gallop.  Abdomen: No hepatomegaly, bowel sounds present, nontender.  Extremities: Chronic venous stasis and 1+ pitting edema with 1+ pulses.  Skin: Warm and dry. No ecchymosis or petechia.  Musculoskeletal: No gross deformities.  Neuropsychiatric: Alert and oriented x3, affect appropriate.   Problem List and Plan   ATRIAL FIBRILLATION Permanent, continue strategy of heart rate control and anticoagulation.  AORTIC ANEURYSM Most recently 4.6 cm by MRA in July 2013. Keep followup with Dr. Dorris Fetch.  ESSENTIAL HYPERTENSION, BENIGN Continue medical therapy, followup with Dr. Adria Dill.  MIXED HYPERLIPIDEMIA He continues on Pravachol, followed by Dr. Adria Dill.    Jonelle Sidle, M.D., F.A.C.C.

## 2012-08-24 NOTE — Patient Instructions (Addendum)

## 2012-08-27 ENCOUNTER — Telehealth: Payer: Self-pay | Admitting: *Deleted

## 2012-08-27 MED ORDER — CARVEDILOL 6.25 MG PO TABS: 6.2500 mg | ORAL_TABLET | Freq: Two times a day (BID) | ORAL | Status: DC

## 2012-08-27 NOTE — Telephone Encounter (Signed)
Noted incoming refill request fir ot carvedilol 6.25mg  bid, rx sent to pharmacy by e-script

## 2012-08-31 DIAGNOSIS — C61 Malignant neoplasm of prostate: Secondary | ICD-10-CM

## 2012-08-31 DIAGNOSIS — C772 Secondary and unspecified malignant neoplasm of intra-abdominal lymph nodes: Secondary | ICD-10-CM

## 2012-08-31 DIAGNOSIS — Z452 Encounter for adjustment and management of vascular access device: Secondary | ICD-10-CM

## 2012-09-02 ENCOUNTER — Encounter: Payer: Medicare Other | Admitting: Internal Medicine

## 2012-09-02 DIAGNOSIS — J40 Bronchitis, not specified as acute or chronic: Secondary | ICD-10-CM

## 2012-09-02 DIAGNOSIS — D649 Anemia, unspecified: Secondary | ICD-10-CM

## 2012-09-02 DIAGNOSIS — Z5111 Encounter for antineoplastic chemotherapy: Secondary | ICD-10-CM

## 2012-09-02 DIAGNOSIS — C61 Malignant neoplasm of prostate: Secondary | ICD-10-CM

## 2012-10-04 DIAGNOSIS — C61 Malignant neoplasm of prostate: Secondary | ICD-10-CM

## 2012-10-04 DIAGNOSIS — C772 Secondary and unspecified malignant neoplasm of intra-abdominal lymph nodes: Secondary | ICD-10-CM

## 2012-10-04 DIAGNOSIS — Z5111 Encounter for antineoplastic chemotherapy: Secondary | ICD-10-CM

## 2012-10-05 ENCOUNTER — Ambulatory Visit (INDEPENDENT_AMBULATORY_CARE_PROVIDER_SITE_OTHER): Payer: Medicare Other | Admitting: *Deleted

## 2012-10-05 DIAGNOSIS — I4891 Unspecified atrial fibrillation: Secondary | ICD-10-CM

## 2012-10-05 DIAGNOSIS — Z7901 Long term (current) use of anticoagulants: Secondary | ICD-10-CM

## 2012-10-12 DIAGNOSIS — Z452 Encounter for adjustment and management of vascular access device: Secondary | ICD-10-CM

## 2012-10-12 DIAGNOSIS — C61 Malignant neoplasm of prostate: Secondary | ICD-10-CM

## 2012-11-12 ENCOUNTER — Ambulatory Visit (INDEPENDENT_AMBULATORY_CARE_PROVIDER_SITE_OTHER): Payer: Medicare Other | Admitting: *Deleted

## 2012-11-12 DIAGNOSIS — Z7901 Long term (current) use of anticoagulants: Secondary | ICD-10-CM

## 2012-11-12 DIAGNOSIS — I4891 Unspecified atrial fibrillation: Secondary | ICD-10-CM

## 2012-11-29 DIAGNOSIS — Z452 Encounter for adjustment and management of vascular access device: Secondary | ICD-10-CM

## 2012-11-29 DIAGNOSIS — C61 Malignant neoplasm of prostate: Secondary | ICD-10-CM

## 2012-12-02 DIAGNOSIS — M19019 Primary osteoarthritis, unspecified shoulder: Secondary | ICD-10-CM | POA: Insufficient documentation

## 2012-12-04 ENCOUNTER — Other Ambulatory Visit: Payer: Self-pay | Admitting: Cardiology

## 2012-12-24 ENCOUNTER — Ambulatory Visit (INDEPENDENT_AMBULATORY_CARE_PROVIDER_SITE_OTHER): Payer: Medicare Other | Admitting: *Deleted

## 2012-12-24 DIAGNOSIS — I4891 Unspecified atrial fibrillation: Secondary | ICD-10-CM

## 2012-12-24 DIAGNOSIS — Z7901 Long term (current) use of anticoagulants: Secondary | ICD-10-CM

## 2012-12-24 LAB — POCT INR: INR: 1.7

## 2013-01-04 DIAGNOSIS — Z5111 Encounter for antineoplastic chemotherapy: Secondary | ICD-10-CM

## 2013-01-04 DIAGNOSIS — C61 Malignant neoplasm of prostate: Secondary | ICD-10-CM

## 2013-01-04 DIAGNOSIS — Z859 Personal history of malignant neoplasm, unspecified: Secondary | ICD-10-CM

## 2013-01-14 ENCOUNTER — Ambulatory Visit (INDEPENDENT_AMBULATORY_CARE_PROVIDER_SITE_OTHER): Payer: Medicare Other | Admitting: *Deleted

## 2013-01-14 DIAGNOSIS — I4891 Unspecified atrial fibrillation: Secondary | ICD-10-CM

## 2013-01-14 DIAGNOSIS — Z7901 Long term (current) use of anticoagulants: Secondary | ICD-10-CM

## 2013-01-18 ENCOUNTER — Other Ambulatory Visit: Payer: Self-pay | Admitting: Cardiology

## 2013-02-11 ENCOUNTER — Ambulatory Visit (INDEPENDENT_AMBULATORY_CARE_PROVIDER_SITE_OTHER): Payer: Medicare Other | Admitting: *Deleted

## 2013-02-11 DIAGNOSIS — I4891 Unspecified atrial fibrillation: Secondary | ICD-10-CM

## 2013-02-11 DIAGNOSIS — Z7901 Long term (current) use of anticoagulants: Secondary | ICD-10-CM

## 2013-02-11 LAB — POCT INR: INR: 2.4

## 2013-02-15 DIAGNOSIS — Z452 Encounter for adjustment and management of vascular access device: Secondary | ICD-10-CM

## 2013-02-15 DIAGNOSIS — C7A098 Malignant carcinoid tumors of other sites: Secondary | ICD-10-CM

## 2013-03-25 ENCOUNTER — Ambulatory Visit (INDEPENDENT_AMBULATORY_CARE_PROVIDER_SITE_OTHER): Payer: Medicare Other | Admitting: *Deleted

## 2013-03-25 DIAGNOSIS — I4891 Unspecified atrial fibrillation: Secondary | ICD-10-CM

## 2013-03-25 DIAGNOSIS — Z7901 Long term (current) use of anticoagulants: Secondary | ICD-10-CM

## 2013-03-25 LAB — POCT INR: INR: 1.9

## 2013-03-29 DIAGNOSIS — Z5111 Encounter for antineoplastic chemotherapy: Secondary | ICD-10-CM

## 2013-03-29 DIAGNOSIS — C7A098 Malignant carcinoid tumors of other sites: Secondary | ICD-10-CM

## 2013-03-29 DIAGNOSIS — C61 Malignant neoplasm of prostate: Secondary | ICD-10-CM

## 2013-04-08 DIAGNOSIS — D696 Thrombocytopenia, unspecified: Secondary | ICD-10-CM

## 2013-04-08 DIAGNOSIS — Z5111 Encounter for antineoplastic chemotherapy: Secondary | ICD-10-CM

## 2013-04-08 DIAGNOSIS — C61 Malignant neoplasm of prostate: Secondary | ICD-10-CM

## 2013-04-08 DIAGNOSIS — C7A00 Malignant carcinoid tumor of unspecified site: Secondary | ICD-10-CM

## 2013-04-08 DIAGNOSIS — R358 Other polyuria: Secondary | ICD-10-CM

## 2013-04-12 DIAGNOSIS — Z23 Encounter for immunization: Secondary | ICD-10-CM

## 2013-04-12 DIAGNOSIS — C7A098 Malignant carcinoid tumors of other sites: Secondary | ICD-10-CM

## 2013-04-12 DIAGNOSIS — C61 Malignant neoplasm of prostate: Secondary | ICD-10-CM

## 2013-04-12 DIAGNOSIS — D649 Anemia, unspecified: Secondary | ICD-10-CM

## 2013-04-12 DIAGNOSIS — D696 Thrombocytopenia, unspecified: Secondary | ICD-10-CM

## 2013-04-22 ENCOUNTER — Ambulatory Visit (INDEPENDENT_AMBULATORY_CARE_PROVIDER_SITE_OTHER): Payer: Medicare Other | Admitting: Pharmacist

## 2013-04-22 DIAGNOSIS — I4891 Unspecified atrial fibrillation: Secondary | ICD-10-CM

## 2013-04-22 DIAGNOSIS — Z7901 Long term (current) use of anticoagulants: Secondary | ICD-10-CM

## 2013-04-22 LAB — POCT INR: INR: 2.5

## 2013-04-25 ENCOUNTER — Other Ambulatory Visit: Payer: Self-pay | Admitting: *Deleted

## 2013-04-25 DIAGNOSIS — I712 Thoracic aortic aneurysm, without rupture: Secondary | ICD-10-CM

## 2013-05-10 ENCOUNTER — Ambulatory Visit
Admission: RE | Admit: 2013-05-10 | Discharge: 2013-05-10 | Disposition: A | Discharge status: Home / Self Care | Payer: Medicare Other | Source: Ambulatory Visit | Attending: Thoracic Surgery (Cardiothoracic Vascular Surgery) | Admitting: Thoracic Surgery (Cardiothoracic Vascular Surgery)

## 2013-05-10 ENCOUNTER — Ambulatory Visit (INDEPENDENT_AMBULATORY_CARE_PROVIDER_SITE_OTHER): Payer: Medicare Other | Admitting: Thoracic Surgery (Cardiothoracic Vascular Surgery)

## 2013-05-10 ENCOUNTER — Encounter: Payer: Self-pay | Admitting: Thoracic Surgery (Cardiothoracic Vascular Surgery)

## 2013-05-10 VITALS — BP 180/98 | HR 86 | Resp 20 | Ht 69.0 in | Wt 308.0 lb

## 2013-05-10 DIAGNOSIS — I712 Thoracic aortic aneurysm, without rupture, unspecified: Secondary | ICD-10-CM

## 2013-05-10 MED ORDER — IOHEXOL 350 MG/ML SOLN
80.0000 mL | Freq: Once | INTRAVENOUS | Status: AC | PRN
  Administered 2013-05-10: 80 mL via INTRAVENOUS

## 2013-05-10 NOTE — Progress Notes (Signed)
HPI:  Darren Parker returns today for a scheduled one-year followup appointment regarding his ascending aortic aneurysm. He was first noted to have an ascending aortic aneurysm in 2006. It was 4.5 cm at that time. We have been following him with scans since that time.  He also has a history of chronic atrial fibrillation for which is on Coumadin. He has a history of prostate cancer. I last winter he had a needle biopsy of a retroperitoneal lymph node which was positive for poorly differentiated cancer. He's been receiving Lupron shots for that. He says is gained about 30 pounds over the past year.  He denies any chest pain or shortness of breath. His primary complaint is his right shoulder. He complains of pain and stiffness in the shoulder and says he needs to have a shoulder replacement. He is supposed to see the orthopedist in [place] next week.  Past Medical History  Diagnosis Date  . Atrial fibrillation   . Hyperlipidemia   . Essential hypertension, benign   . Obstructive sleep apnea   . Type 2 diabetes mellitus   . Dilation of thoracic aorta     Ascending - CT scan 10/2008, 4.3 cm proximal   . 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 Prostate cancer   . Thrombocytopenia   . Iron deficiency anemia   . Venous insufficiency       Current Outpatient Prescriptions  Medication Sig Dispense Refill  . allopurinol (ZYLOPRIM) 300 MG tablet Take 300 mg by mouth daily.       Marland Kitchen amLODipine (NORVASC) 5 MG tablet Take 1 tablet (5 mg total) by mouth daily.  90 tablet  3  . bicalutamide (CASODEX) 50 MG tablet Take 50 mg by mouth daily.      . calcium citrate (CALCITRATE - DOSED IN MG ELEMENTAL CALCIUM) 950 MG tablet Take 200 mg of elemental calcium by mouth daily.      . carboxymethylcellulose (REFRESH PLUS) 0.5 % SOLN 1 drop as needed.      . carvedilol (COREG) 6.25 MG tablet TAKE 1 TABLET TWICE DAILY  WITH  A  MEAL  180 tablet   1  . Ergocalciferol (VITAMIN D2 PO) Take 1.25 mg by mouth once a week.      Marland Kitchen HYDROcodone-acetaminophen (NORCO) 10-325 MG per tablet Take 1-2 tablets by mouth every 6 (six) hours as needed. For pain      . Leuprolide Acetate (LUPRON DEPOT IM) One injection per month, then after May - will change to every 3 months.      . levothyroxine (SYNTHROID, LEVOTHROID) 112 MCG tablet Take 112 mcg by mouth daily.       Marland Kitchen loratadine (CLARITIN) 10 MG tablet Take 10 mg by mouth daily.      . metFORMIN (GLUCOPHAGE) 500 MG tablet Take 500 mg by mouth 2 (two) times daily with a meal. 2 tabs bid      . potassium chloride SA (K-DUR,KLOR-CON) 20 MEQ tablet Take 20 mEq by mouth 2 (two) times daily.       . pravastatin (PRAVACHOL) 40 MG tablet Take 40 mg by mouth daily.        . tamsulosin (FLOMAX) 0.4 MG CAPS capsule Take 0.4 mg by mouth daily.      Marland Kitchen triamcinolone cream (KENALOG) 0.1 % Apply 1 application topically 2 (two) times daily.      Marland Kitchen warfarin (COUMADIN) 5 MG tablet TAKE 1 AND 1/2  TABLETS (7.5 MG) ON MONDAY AND THURSDAY AND TAKE 1 TABLET (5 MG) ON ALL OTHER DAYS  104 tablet  2   No current facility-administered medications for this visit.    Physical Exam BP 180/98  Pulse 86  Resp 20  Ht 5\' 9"  (1.753 m)  Wt 308 lb (139.708 kg)  BMI 45.46 kg/m2  SpO2 93% Morbidly obese 80 year old male in no acute distress Neurologic alert and oriented x3 Cardiac irregularly irregular with 3/6 high-pitched systolic murmur Lungs clear with equal breath sounds bilaterally  Diagnostic Tests: CT of chest 05/10/2013 CT ANGIOGRAPHY CHEST WITH CONTRAST  TECHNIQUE:  Multidetector CT imaging of the chest was performed using the  standard protocol during bolus administration of intravenous  contrast. Multiplanar CT image reconstructions including MIPs were  obtained to evaluate the vascular anatomy.  CONTRAST: 23mL OMNIPAQUE IOHEXOL 350 MG/ML SOLN  COMPARISON: CT ANGIO CHEST W/CM &/OR WO/CM dated 10/17/2010; MR  MRA  CHEST WO/W CM dated 11/04/2011  FINDINGS:  Using the same measurement convention as comparison CT the ascending  aorta measures 45 x 44 mm at level of the main pulmonary artery  (image 53, series 8) compared to 44 x 46 mm on prior. The transverse  aorta and descending thoracic aorta are normal caliber. The  descending thoracic aorta measures 29 mm. Great vessels are normal.  There is no pericardial fluid. Coronary artery calcifications are  noted.  No axillary or supraclavicular lymphadenopathy. No mediastinal hilar  lymphadenopathy. Review of the lung parenchyma demonstrates no acute  pulmonary parenchymal findings. Limited view of the upper abdomen is  unremarkable. Small gallstone within the gallbladder. The degenerate  spurring of the spine.  Review of the MIP images confirms the above findings.  IMPRESSION:  No significant change in ascending aortic aneurysm measuring 45 mm  in greatest diameter.  Electronically Signed  By: Suzy Bouchard M.D.  On: 05/10/2013 14:38   Impression: 80 year old man with 4.5 centimeter ascending aortic aneurysm. This has been stable for going on 8 years now. There is no indication for surgery.  Darren Parker asked if I could clear him for his upcoming shoulder surgery. From the standpoint of his ascending aortic aneurysm there is no reason that he cannot undergo shoulder replacement. However, in my opinion he needs formal cardiology clearance given his history of atrial fibrillation and his heart murmur. He last saw Dr. Domenic Polite in April of this year. We will see if we can get him set up for a followup appointment with him in the near future.  Plan: Return in one year with CT angiogram of chest

## 2013-05-11 ENCOUNTER — Telehealth: Payer: Self-pay | Admitting: *Deleted

## 2013-05-11 ENCOUNTER — Encounter: Payer: Self-pay | Admitting: Cardiology

## 2013-05-11 ENCOUNTER — Encounter: Payer: Medicare Other | Admitting: Cardiology

## 2013-05-11 DIAGNOSIS — Z01810 Encounter for preprocedural cardiovascular examination: Secondary | ICD-10-CM | POA: Insufficient documentation

## 2013-05-11 NOTE — Telephone Encounter (Signed)
.  left message to have patient return my call.  

## 2013-05-11 NOTE — Telephone Encounter (Signed)
For dental extractions, would recommend stopping Coumadin 4 days prior. He does not require any SBE antibiotic prophylaxis for cardiac reasons.

## 2013-05-11 NOTE — Progress Notes (Signed)
Patient canceled.  This encounter was created in error - please disregard. 

## 2013-05-11 NOTE — Telephone Encounter (Signed)
This nurse spoke to [place] with the pt dental office to advise the pt wife had his appt's mixed up and they are NOT performing any extractions today and just want the instructions requested below faxed via letter, pt apt in this office rescheduled for 05-18-13, please advise

## 2013-05-11 NOTE — Telephone Encounter (Signed)
Calling to find what pre-meds and anesthesia would be recommended he has four teeth that need to be pulled. Also need to know how many days to come off coumadin  Please fax letter to [phone]

## 2013-05-13 NOTE — Telephone Encounter (Signed)
This nurse spoke to [place] to give verbal orders for medication regimen and advised the phone notation from Dr. Domenic Polite  Will be faxed today for their files. Pt made aware verbally from this nurse

## 2013-05-18 ENCOUNTER — Encounter: Payer: Self-pay | Admitting: Cardiology

## 2013-05-18 ENCOUNTER — Ambulatory Visit (INDEPENDENT_AMBULATORY_CARE_PROVIDER_SITE_OTHER): Payer: Medicare Other | Admitting: Cardiology

## 2013-05-18 VITALS — BP 152/80 | HR 82 | Ht 69.0 in | Wt 318.0 lb

## 2013-05-18 DIAGNOSIS — Z01810 Encounter for preprocedural cardiovascular examination: Secondary | ICD-10-CM

## 2013-05-18 DIAGNOSIS — I719 Aortic aneurysm of unspecified site, without rupture: Secondary | ICD-10-CM

## 2013-05-18 DIAGNOSIS — R011 Cardiac murmur, unspecified: Secondary | ICD-10-CM

## 2013-05-18 DIAGNOSIS — I4891 Unspecified atrial fibrillation: Secondary | ICD-10-CM

## 2013-05-18 DIAGNOSIS — I10 Essential (primary) hypertension: Secondary | ICD-10-CM

## 2013-05-18 NOTE — Assessment & Plan Note (Signed)
Keep regular followup with Dr. Raeanne Gathers.

## 2013-05-18 NOTE — Patient Instructions (Addendum)
Your physician wants you to follow-up in:  6 MONTHS You will receive a reminder letter in the mail two months in advance. If you don't receive a letter, please call our office to schedule the follow-up appointment.  Your physician has requested that you have an echocardiogram. Echocardiography is a painless test that uses sound waves to create images of your heart. It provides your doctor with information about the size and shape of your heart and how well your heart's chambers and valves are working. This procedure takes approximately one hour. There are no restrictions for this procedure.  WE WILL CALL YOU WITH YOUR TEST RESULTS/INSTRUCTIONS/NEXT STEPS ONCE RECEIVED BY THE PROVIDER

## 2013-05-18 NOTE — Assessment & Plan Note (Signed)
Permanent, continue strategy of heart rate control and anticoagulation.

## 2013-05-18 NOTE — Progress Notes (Signed)
Clinical Summary Mr. Sevigny is a 80 y.o.male last seen in April 2014. Recent interval visit with Dr. Roxan Hockey noted in early January for followup of his known asymptomatic ascending aortic aneurysm that has been stable at 4.5 cm, no indication for surgery.  He is being considered for right shoulder replacement with Dr. Berenice Primas at [facility], likely sometime in February.  From a cardiac perspective he reports stable NYHA class II dyspnea, no palpitations, no exertional chest pain. His atrial fibrillation has been stable for quite some time. ECG today shows atrial fibrillation with leftward axis and small R prime in lead V1 and V2.   He has not had a followup echocardiogram recently, asymptomatic systolic murmur on examination which has been chronic.   Allergies  Allergen Reactions  . Sulfa Drugs Cross Reactors Other (See Comments)    Does not remember reaction; was "years and years ago."    Current Outpatient Prescriptions  Medication Sig Dispense Refill  . allopurinol (ZYLOPRIM) 300 MG tablet Take 300 mg by mouth daily.       Marland Kitchen amLODipine (NORVASC) 5 MG tablet Take 1 tablet (5 mg total) by mouth daily.  90 tablet  3  . bicalutamide (CASODEX) 50 MG tablet Take 50 mg by mouth daily.      . calcium citrate (CALCITRATE - DOSED IN MG ELEMENTAL CALCIUM) 950 MG tablet Take 200 mg of elemental calcium by mouth daily.      . carboxymethylcellulose (REFRESH PLUS) 0.5 % SOLN 1 drop as needed.      . carvedilol (COREG) 6.25 MG tablet TAKE 1 TABLET TWICE DAILY  WITH  A  MEAL  180 tablet  1  . Ergocalciferol (VITAMIN D2 PO) Take 1.25 mg by mouth once a week.      Marland Kitchen HYDROcodone-acetaminophen (NORCO) 10-325 MG per tablet Take 1-2 tablets by mouth every 6 (six) hours as needed. For pain      . Leuprolide Acetate (LUPRON DEPOT IM) One injection per month, then after May - will change to every 3 months.      . levothyroxine (SYNTHROID, LEVOTHROID) 112 MCG tablet Take 112 mcg by mouth daily.       Marland Kitchen  loratadine (CLARITIN) 10 MG tablet Take 10 mg by mouth daily.      . metFORMIN (GLUCOPHAGE) 500 MG tablet Take 500 mg by mouth 2 (two) times daily with a meal. 2 tabs bid      . potassium chloride SA (K-DUR,KLOR-CON) 20 MEQ tablet Take 20 mEq by mouth 2 (two) times daily.       . pravastatin (PRAVACHOL) 40 MG tablet Take 40 mg by mouth daily.        . tamsulosin (FLOMAX) 0.4 MG CAPS capsule Take 0.4 mg by mouth daily.      Marland Kitchen triamcinolone cream (KENALOG) 0.1 % Apply 1 application topically 2 (two) times daily.      Marland Kitchen warfarin (COUMADIN) 5 MG tablet TAKE 1 AND 1/2 TABLETS (7.5 MG) ON MONDAY AND THURSDAY AND TAKE 1 TABLET (5 MG) ON ALL OTHER DAYS  104 tablet  2   No current facility-administered medications for this visit.    Past Medical History  Diagnosis Date  . Atrial fibrillation   . Hyperlipidemia   . Essential hypertension, benign   . Obstructive sleep apnea   . Type 2 diabetes mellitus   . Dilation of thoracic aorta     Ascending - CT scan 10/2008, 4.3 cm proximal   . 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 Prostate cancer   . Thrombocytopenia   . Iron deficiency anemia   . Venous insufficiency     Social History Mr. Omary reports that he has never smoked. He has never used smokeless tobacco. Mr. Lady reports that he does not drink alcohol.  Review of Systems Right shoulder pain and weakness. No dizziness or syncope. Otherwise negative except as outlined.  Physical Examination Filed Vitals:   05/18/13 0919  BP: 152/80  Pulse: 82   Filed Weights   05/18/13 0919  Weight: 318 lb (144.244 kg)    Obese male, in no acute distress.  HEENT: Conjunctiva and lids normal, oropharynx with moist mucosa.  Neck: Increased girth, no obvious carotid bruits or thyromegaly.  Lungs: Clear to auscultation, nonlabored. Diminished.  Cardiac: Indistinct PMI, irregularly irregular, 6-9/4 systolic murmur at the base,  no S3 gallop.  Abdomen: No hepatomegaly, bowel sounds present, nontender.  Extremities: Chronic venous stasis and 1+ pitting edema with 1+ pulses.  Skin: Warm and dry. No ecchymosis or petechia.  Musculoskeletal: No gross deformities.  Neuropsychiatric: Alert and oriented x3, affect appropriate.   Problem List and Plan   Preoperative cardiovascular examination Patient being considered for elective right shoulder replacement by Dr. Berenice Primas at [facility]. He has been symptomatically stable from a cardiac perspective, no angina, no palpitations, no CHF symptoms. History is reviewed above, we have been following him principally with long-term atrial fibrillation on Coumadin. Followup echocardiogram to be obtained to assess valvular status. Unless there are unanticipated changes that require further evaluation, I would expect that he should be able to proceed with planned surgery at an acceptable perioperative cardiac risk. He should be able to hold Coumadin for surgery without Lovenox bridge  AORTIC ANEURYSM Asymptomatic, stable at 4.5 cm, recent followup with Dr. Roxan Hockey noted. This does not represent a contraindication to shoulder surgery either at this point.  Atrial fibrillation Permanent, continue strategy of heart rate control and anticoagulation.  Essential hypertension, benign Keep regular followup with Dr. Raeanne Gathers.    Satira Sark, M.D., F.A.C.C.

## 2013-05-18 NOTE — Assessment & Plan Note (Signed)
Patient being considered for elective right shoulder replacement by Dr. Berenice Primas at [facility]. He has been symptomatically stable from a cardiac perspective, no angina, no palpitations, no CHF symptoms. History is reviewed above, we have been following him principally with long-term atrial fibrillation on Coumadin. Followup echocardiogram to be obtained to assess valvular status. Unless there are unanticipated changes that require further evaluation, I would expect that he should be able to proceed with planned surgery at an acceptable perioperative cardiac risk. He should be able to hold Coumadin for surgery without Lovenox bridge

## 2013-05-18 NOTE — Assessment & Plan Note (Signed)
Asymptomatic, stable at 4.5 cm, recent followup with Dr. Roxan Hockey noted. This does not represent a contraindication to shoulder surgery either at this point.

## 2013-05-23 ENCOUNTER — Ambulatory Visit (HOSPITAL_COMMUNITY)
Admission: RE | Admit: 2013-05-23 | Discharge: 2013-05-23 | Disposition: A | Discharge status: Home / Self Care | Payer: Medicare Other | Source: Ambulatory Visit | Attending: Cardiology | Admitting: Cardiology

## 2013-05-23 DIAGNOSIS — I10 Essential (primary) hypertension: Secondary | ICD-10-CM | POA: Insufficient documentation

## 2013-05-23 DIAGNOSIS — E119 Type 2 diabetes mellitus without complications: Secondary | ICD-10-CM | POA: Insufficient documentation

## 2013-05-23 DIAGNOSIS — R011 Cardiac murmur, unspecified: Secondary | ICD-10-CM | POA: Insufficient documentation

## 2013-05-23 DIAGNOSIS — I359 Nonrheumatic aortic valve disorder, unspecified: Secondary | ICD-10-CM

## 2013-05-23 DIAGNOSIS — I4891 Unspecified atrial fibrillation: Secondary | ICD-10-CM | POA: Insufficient documentation

## 2013-05-23 NOTE — Progress Notes (Signed)
*  PRELIMINARY RESULTS* Echocardiogram 2D Echocardiogram has been performed.  [place], [place] 05/23/2013, 1:43 PM

## 2013-05-25 ENCOUNTER — Telehealth: Payer: Self-pay | Admitting: *Deleted

## 2013-05-25 NOTE — Telephone Encounter (Signed)
Patient informed and copy sent to surgeon.

## 2013-05-25 NOTE — Telephone Encounter (Signed)
Message copied by Merlene Laughter on Wed May 25, 2013  4:45 PM ------      Message from: MCDOWELL, Aloha Gell      Created: Mon May 23, 2013  5:29 PM       Reviewed. LVEF is normal and there is moderate aortic stenosis at this point. See recent office note.  He has been clinically stable and should still be able to proceed with shoulder surgery at an acceptable cardiac risk.  Please send copy of this and my note to surgeon and let patient know. ------

## 2013-06-01 ENCOUNTER — Other Ambulatory Visit: Payer: Self-pay | Admitting: Cardiology

## 2013-06-14 ENCOUNTER — Ambulatory Visit (INDEPENDENT_AMBULATORY_CARE_PROVIDER_SITE_OTHER): Payer: Medicare Other | Admitting: *Deleted

## 2013-06-14 DIAGNOSIS — I4891 Unspecified atrial fibrillation: Secondary | ICD-10-CM

## 2013-06-14 DIAGNOSIS — Z5181 Encounter for therapeutic drug level monitoring: Secondary | ICD-10-CM

## 2013-06-14 DIAGNOSIS — Z7901 Long term (current) use of anticoagulants: Secondary | ICD-10-CM

## 2013-06-14 LAB — POCT INR: INR: 2.2

## 2013-07-15 ENCOUNTER — Ambulatory Visit (INDEPENDENT_AMBULATORY_CARE_PROVIDER_SITE_OTHER): Payer: Medicare Other | Admitting: *Deleted

## 2013-07-15 DIAGNOSIS — Z7901 Long term (current) use of anticoagulants: Secondary | ICD-10-CM

## 2013-07-15 DIAGNOSIS — I4891 Unspecified atrial fibrillation: Secondary | ICD-10-CM

## 2013-07-15 DIAGNOSIS — Z5181 Encounter for therapeutic drug level monitoring: Secondary | ICD-10-CM

## 2013-07-15 LAB — POCT INR: INR: 2

## 2013-07-25 ENCOUNTER — Other Ambulatory Visit: Payer: Self-pay | Admitting: Oncology

## 2013-07-25 DIAGNOSIS — D4959 Neoplasm of unspecified behavior of other genitourinary organ: Secondary | ICD-10-CM

## 2013-07-29 ENCOUNTER — Ambulatory Visit
Admission: RE | Admit: 2013-07-29 | Discharge: 2013-07-29 | Disposition: A | Discharge status: Home / Self Care | Payer: Medicare Other | Source: Ambulatory Visit | Attending: Oncology | Admitting: Oncology

## 2013-07-29 ENCOUNTER — Ambulatory Visit: Admission: RE | Admit: 2013-07-29 | Payer: Medicare Other | Source: Ambulatory Visit

## 2013-07-29 ENCOUNTER — Other Ambulatory Visit: Payer: Medicare Other

## 2013-07-29 ENCOUNTER — Other Ambulatory Visit: Payer: Self-pay | Admitting: Oncology

## 2013-07-29 DIAGNOSIS — D4959 Neoplasm of unspecified behavior of other genitourinary organ: Secondary | ICD-10-CM

## 2013-07-29 DIAGNOSIS — C61 Malignant neoplasm of prostate: Secondary | ICD-10-CM

## 2013-07-29 DIAGNOSIS — Z859 Personal history of malignant neoplasm, unspecified: Secondary | ICD-10-CM

## 2013-08-01 ENCOUNTER — Other Ambulatory Visit: Payer: Medicare Other

## 2013-08-03 ENCOUNTER — Other Ambulatory Visit: Payer: Self-pay | Admitting: Cardiology

## 2013-08-12 ENCOUNTER — Ambulatory Visit (INDEPENDENT_AMBULATORY_CARE_PROVIDER_SITE_OTHER): Payer: Medicare Other | Admitting: *Deleted

## 2013-08-12 DIAGNOSIS — Z5181 Encounter for therapeutic drug level monitoring: Secondary | ICD-10-CM

## 2013-08-12 DIAGNOSIS — I4891 Unspecified atrial fibrillation: Secondary | ICD-10-CM

## 2013-08-12 DIAGNOSIS — Z7901 Long term (current) use of anticoagulants: Secondary | ICD-10-CM

## 2013-08-12 LAB — POCT INR: INR: 3.2

## 2013-09-09 ENCOUNTER — Ambulatory Visit (INDEPENDENT_AMBULATORY_CARE_PROVIDER_SITE_OTHER): Payer: Medicare Other | Admitting: *Deleted

## 2013-09-09 DIAGNOSIS — I4891 Unspecified atrial fibrillation: Secondary | ICD-10-CM

## 2013-09-09 DIAGNOSIS — Z7901 Long term (current) use of anticoagulants: Secondary | ICD-10-CM

## 2013-09-09 DIAGNOSIS — Z5181 Encounter for therapeutic drug level monitoring: Secondary | ICD-10-CM

## 2013-09-09 LAB — POCT INR: INR: 3

## 2013-10-07 ENCOUNTER — Ambulatory Visit (INDEPENDENT_AMBULATORY_CARE_PROVIDER_SITE_OTHER): Payer: Medicare Other | Admitting: *Deleted

## 2013-10-07 DIAGNOSIS — I4891 Unspecified atrial fibrillation: Secondary | ICD-10-CM

## 2013-10-07 DIAGNOSIS — Z7901 Long term (current) use of anticoagulants: Secondary | ICD-10-CM

## 2013-10-07 DIAGNOSIS — Z5181 Encounter for therapeutic drug level monitoring: Secondary | ICD-10-CM

## 2013-10-07 LAB — POCT INR: INR: 3.2

## 2013-10-29 ENCOUNTER — Encounter: Payer: Self-pay | Admitting: Cardiology

## 2013-10-31 ENCOUNTER — Encounter: Payer: Self-pay | Admitting: Cardiology

## 2013-11-08 ENCOUNTER — Ambulatory Visit (INDEPENDENT_AMBULATORY_CARE_PROVIDER_SITE_OTHER): Payer: Medicare Other | Admitting: *Deleted

## 2013-11-08 DIAGNOSIS — Z7901 Long term (current) use of anticoagulants: Secondary | ICD-10-CM

## 2013-11-08 DIAGNOSIS — I4891 Unspecified atrial fibrillation: Secondary | ICD-10-CM

## 2013-11-08 DIAGNOSIS — Z5181 Encounter for therapeutic drug level monitoring: Secondary | ICD-10-CM

## 2013-11-08 LAB — POCT INR: INR: 1.9

## 2013-11-17 ENCOUNTER — Encounter: Payer: Self-pay | Admitting: Cardiology

## 2013-11-17 ENCOUNTER — Ambulatory Visit (INDEPENDENT_AMBULATORY_CARE_PROVIDER_SITE_OTHER): Payer: Medicare Other | Admitting: Cardiology

## 2013-11-17 VITALS — BP 127/67 | HR 61 | Ht 69.0 in | Wt 311.1 lb

## 2013-11-17 DIAGNOSIS — I5032 Chronic diastolic (congestive) heart failure: Secondary | ICD-10-CM

## 2013-11-17 DIAGNOSIS — I359 Nonrheumatic aortic valve disorder, unspecified: Secondary | ICD-10-CM

## 2013-11-17 DIAGNOSIS — I482 Chronic atrial fibrillation, unspecified: Secondary | ICD-10-CM

## 2013-11-17 DIAGNOSIS — I350 Nonrheumatic aortic (valve) stenosis: Secondary | ICD-10-CM

## 2013-11-17 DIAGNOSIS — R0989 Other specified symptoms and signs involving the circulatory and respiratory systems: Secondary | ICD-10-CM

## 2013-11-17 DIAGNOSIS — I4891 Unspecified atrial fibrillation: Secondary | ICD-10-CM

## 2013-11-17 MED ORDER — FUROSEMIDE 40 MG PO TABS: 60.0000 mg | ORAL_TABLET | Freq: Every day | ORAL | Status: DC

## 2013-11-17 NOTE — Assessment & Plan Note (Signed)
Could be referred cardiac murmur. Obtain carotid Dopplers to clarify.

## 2013-11-17 NOTE — Assessment & Plan Note (Signed)
Increase Lasix to 60 mg in the morning, follow daily weights.

## 2013-11-17 NOTE — Assessment & Plan Note (Signed)
Chronic, continue strategy of heart rate control and anticoagulation.

## 2013-11-17 NOTE — Patient Instructions (Signed)
Your physician recommends that you schedule a follow-up appointment in: 6 months. You will receive a reminder letter in the mail in about 4 months reminding you to call and schedule your appointment. If you don't receive this letter, please contact our office. Your physician has recommended you make the following change in your medication:  Take furosemide 60 mg daily. Please take 1&1/2 tablets daily of your 40 mg tablet. Continue all other medications the same. Your physician has requested that you have a carotid duplex. This test is an ultrasound of the carotid arteries in your neck. It looks at blood flow through these arteries that supply the brain with blood. Allow one hour for this exam. There are no restrictions or special instructions. Your physician has requested that you have an echocardiogram in 6 months just before your next visit. Echocardiography is a painless test that uses sound waves to create images of your heart. It provides your doctor with information about the size and shape of your heart and how well your heart's chambers and valves are working. This procedure takes approximately one hour. There are no restrictions for this procedure.

## 2013-11-17 NOTE — Assessment & Plan Note (Signed)
Moderate stenosis (possibly moderate to severe), mean gradient 20 mm mercury and valve area 1.2 cm as of January of this year. Plan to continue medical therapy, followup in at least 6 months with repeat echocardiogram for surveillance.

## 2013-11-17 NOTE — Progress Notes (Signed)
Clinical Summary Darren Parker is a 80 y.o.male last seen in January of this year. He was seen for preoperative consultation at that time prior to shoulder surgery at  [facility]. He tells me that this went well, no cardiac complications.  He is here with his wife today, wearing oxygen. He tells me that he was recently admitted to [facility] with shortness of breath. Record review finds hospitalization at [facility] in June with evidence of volume overload, acute on chronic hypercapnic respiratory failure. He improved with supportive measures including diuresis. He did have documented hypoxia which required supplemental oxygen.  Lab work from June showed BUN 26, creatinine 1.3, potassium 3.6, troponin I 0.05, INR 3.4, BNP 222, hemoglobin 9.3, platelets 112. He has pancytopenia is being evaluated by Dr. Tressie Stalker.  Echocardiogram from June 2015 done at [facility] reported mild to moderate LVH with LVEF 60-65%, mild left atrial enlargement, moderate to severe calcific aortic stenosis with mild aortic regurgitation, MAC with mild to moderate mild mitral regurgitation, RVSP 54 mm mercury. The study was read by Dr. Hamilton Capri [facility]). I do not have the images to review. The echocardiogram done through our practice back in January demonstrated moderate aortic stenosis with mean gradient 20 mm mercury and valve area 1.2 cm.  He states that he feels much better, leg edema is better. Breathing is better. Weight is down 7 pounds from January.   Allergies  Allergen Reactions  . Sulfa Drugs Cross Reactors Other (See Comments)    Does not remember reaction; was "years and years ago."    Current Outpatient Prescriptions  Medication Sig Dispense Refill  . allopurinol (ZYLOPRIM) 300 MG tablet Take 300 mg by mouth daily.       Marland Kitchen amLODipine (NORVASC) 10 MG tablet Take 10 mg by mouth daily.      Marland Kitchen aspirin 81 MG tablet Take 81 mg by mouth daily.      . bicalutamide (CASODEX) 50 MG tablet Take 50 mg by mouth daily.       . calcium citrate (CALCITRATE - DOSED IN MG ELEMENTAL CALCIUM) 950 MG tablet Take 200 mg of elemental calcium by mouth daily.      . carboxymethylcellulose (REFRESH PLUS) 0.5 % SOLN 1 drop as needed.      . carvedilol (COREG) 6.25 MG tablet TAKE 1 TABLET TWICE DAILY WITH A MEAL  180 tablet  1  . Ergocalciferol (VITAMIN D2 PO) Take 1.25 mg by mouth once a week.      . furosemide (LASIX) 40 MG tablet Take 1.5 tablets (60 mg total) by mouth daily.  45 tablet  3  . Leuprolide Acetate (LUPRON DEPOT IM) One injection per month, then after May - will change to every 3 months.      . levothyroxine (SYNTHROID, LEVOTHROID) 112 MCG tablet Take 112 mcg by mouth daily.       . metFORMIN (GLUCOPHAGE) 500 MG tablet 1 tab (500mg ) by mouth every morning & 2 tabs (1,000mg ) every evening      . OXYGEN-HELIUM IN Inhale 2 L/min into the lungs daily.      . potassium chloride SA (K-DUR,KLOR-CON) 20 MEQ tablet Take 20 mEq by mouth 2 (two) times daily.       . pravastatin (PRAVACHOL) 40 MG tablet Take 40 mg by mouth daily.        . tamsulosin (FLOMAX) 0.4 MG CAPS capsule Take 0.4 mg by mouth daily.      . traMADol (ULTRAM) 50 MG tablet Take by mouth  as needed.      . triamcinolone cream (KENALOG) 0.1 % Apply 1 application topically 2 (two) times daily.      . vitamin B-12 (CYANOCOBALAMIN) 1000 MCG tablet Take 1,000 mcg by mouth daily.      Marland Kitchen warfarin (COUMADIN) 5 MG tablet TAKE 1 AND 1/2 TABLETS (7.5 MG) ON MONDAY AND THURSDAY AND TAKE 1 TABLET (5 MG) ON ALL OTHER DAYS  104 tablet  2   No current facility-administered medications for this visit.    Past Medical History  Diagnosis Date  . Atrial fibrillation   . Hyperlipidemia   . Essential hypertension, benign   . Obstructive sleep apnea   . Type 2 diabetes mellitus   . Dilation of thoracic aorta     Ascending - CT scan 10/2008, 4.3 cm proximal   . 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 Prostate  cancer   . Thrombocytopenia   . Iron deficiency anemia   . Venous insufficiency     Social History Darren Parker reports that he has never smoked. He has never used smokeless tobacco. Darren Parker reports that he does not drink alcohol.  Review of Systems No palpitations or dizziness. Appetite is good. Other systems reviewed and negative except as outlined.  Physical Examination Filed Vitals:   11/17/13 1359  BP: 127/67  Pulse: 61   Filed Weights   11/17/13 1359  Weight: 311 lb 1.9 oz (141.123 kg)    Obese male, in no acute distress.  Wearing supple no oxygen via nasal cannula. HEENT: Conjunctiva and lids normal, oropharynx with moist mucosa.  Neck: Increased girth, left carotid bruit, no thyromegaly. Lungs: Clear to auscultation, nonlabored. Diminished.  Cardiac: Indistinct PMI, irregularly irregular, 3/6 systolic murmur at the base, no S3 gallop.  Abdomen: Protuberant, nontender, bowel sounds present.  Extremities: Chronic venous stasis and 1-2+ pitting edema with 1+ pulses.  Skin: Warm and dry. No ecchymosis or petechia.  Musculoskeletal: No gross deformities.  Neuropsychiatric: Alert and oriented x3, affect appropriate.   Problem List and Plan   Aortic stenosis Moderate stenosis (possibly moderate to severe), mean gradient 20 mm mercury and valve area 1.2 cm as of January of this year. Plan to continue medical therapy, followup in at least 6 months with repeat echocardiogram for surveillance.  Atrial fibrillation Chronic, continue strategy of heart rate control and anticoagulation.  Chronic diastolic heart failure Increase Lasix to 60 mg in the morning, follow daily weights.  Left carotid bruit Could be referred cardiac murmur. Obtain carotid Dopplers to clarify.    Satira Sark, M.D., F.A.C.C.

## 2013-11-29 ENCOUNTER — Ambulatory Visit (INDEPENDENT_AMBULATORY_CARE_PROVIDER_SITE_OTHER): Payer: Medicare Other | Admitting: *Deleted

## 2013-11-29 DIAGNOSIS — I4891 Unspecified atrial fibrillation: Secondary | ICD-10-CM

## 2013-11-29 DIAGNOSIS — Z5181 Encounter for therapeutic drug level monitoring: Secondary | ICD-10-CM

## 2013-11-29 DIAGNOSIS — Z7901 Long term (current) use of anticoagulants: Secondary | ICD-10-CM

## 2013-11-29 LAB — POCT INR: INR: 2.6

## 2013-12-08 ENCOUNTER — Ambulatory Visit (HOSPITAL_COMMUNITY)
Admission: RE | Admit: 2013-12-08 | Discharge: 2013-12-08 | Disposition: A | Discharge status: Home / Self Care | Payer: Medicare Other | Source: Ambulatory Visit | Attending: Family Medicine | Admitting: Family Medicine

## 2013-12-08 DIAGNOSIS — I10 Essential (primary) hypertension: Secondary | ICD-10-CM | POA: Insufficient documentation

## 2013-12-08 DIAGNOSIS — M25511 Pain in right shoulder: Secondary | ICD-10-CM

## 2013-12-08 DIAGNOSIS — IMO0001 Reserved for inherently not codable concepts without codable children: Secondary | ICD-10-CM | POA: Insufficient documentation

## 2013-12-08 DIAGNOSIS — M25519 Pain in unspecified shoulder: Secondary | ICD-10-CM | POA: Insufficient documentation

## 2013-12-08 DIAGNOSIS — R262 Difficulty in walking, not elsewhere classified: Secondary | ICD-10-CM | POA: Diagnosis not present

## 2013-12-08 DIAGNOSIS — E119 Type 2 diabetes mellitus without complications: Secondary | ICD-10-CM | POA: Diagnosis not present

## 2013-12-08 DIAGNOSIS — M545 Low back pain, unspecified: Secondary | ICD-10-CM | POA: Diagnosis not present

## 2013-12-08 DIAGNOSIS — M6281 Muscle weakness (generalized): Secondary | ICD-10-CM | POA: Insufficient documentation

## 2013-12-08 DIAGNOSIS — M25611 Stiffness of right shoulder, not elsewhere classified: Secondary | ICD-10-CM | POA: Insufficient documentation

## 2013-12-08 NOTE — Evaluation (Signed)
Physical Therapy Evaluation  Patient Details  Name: Darren Parker MRN: [id number] Date of Birth: 1932-09-21  Today's Date: 12/08/2013 Time: 0930-1015 PT Time Calculation (min): 45 min     Charges: 1 Evaluation, TherAct 1000-1015         Visit#: 1 of 8  Re-eval: 01/07/14 Assessment Diagnosis: Rt low back pain secondary to stiffness and degenerative disc disease Surgical Date:  (not a canidate for surgery) Next MD Visit: Gevena Cotton: 12/29/13 Prior Therapy: no  Authorization: Medicare     Past Medical History:  Past Medical History  Diagnosis Date  . Atrial fibrillation   . Hyperlipidemia   . Essential hypertension, benign   . Obstructive sleep apnea   . Type 2 diabetes mellitus   . Dilation of thoracic aorta     Ascending - CT scan 10/2008, 4.3 cm proximal   . 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 Prostate cancer   . Thrombocytopenia   . Iron deficiency anemia   . Venous insufficiency    Past Surgical History:  Past Surgical History  Procedure Laterality Date  . Right total knee replacement  2010    Subjective Symptoms/Limitations Symptoms: Pain in Rt Low back that radiates mildly to to front of his abdomen.  Pertinent History: 30 year history of low back pain. history of cancer. X-rays showed no cancer, but did show degeneraive disc disease L3,L4,. History of bilateral knee andbilateral hip replacements.  How long can you sit comfortably?: pain with sitting, but not too uncomfortable How long can you stand comfortably?: <59minutes How long can you walk comfortably?: <5 minutes Patient Stated Goals: decreased pain, to be bale to walk, and to beable to exercise to lose weight. Pain Assessment Currently in Pain?: Yes Pain Score: 2  Pain Location: Back Pain Orientation: Right Pain Type: Chronic pain Pain Onset: More than a month ago Pain Frequency: Constant Pain Relieving Factors: nothing Effect of  Pain on Daily Activities: difficulty walking, running, sitting, standing  Cognition/Observation Cognition Overall Cognitive Status: Within Functional Limits for tasks assessed Arousal/Alertness: Awake/alert Orientation Level: Oriented X4 Observation/Other Assessments Observations: 16.5 cm incisional scar on right UE.  Other Assessments: Gait: limited hip mobility, limited stride length,   Sensation/Coordination/Flexibility/Functional Tests Flexibility Thomas: Positive Obers: Negative 90/90: Positive  Assessment RUE Assessment RUE Assessment:  (assessed supine. IR/ER adducted. ) RUE PROM (degrees) Right Shoulder Flexion: 109 Degrees Right Shoulder ABduction: 90 Degrees Right Shoulder Internal Rotation: 71 Degrees Right Shoulder External Rotation: 30 Degrees RLE AROM (degrees) RLE Overall AROM Comments: Hip internal rotation 11 degrees, External rotation 40 Lumbar AROM Lumbar Flexion: 60 Lumbar Extension: 25 Lumbar - Right Side Bend: 100% limited pain at beginning and throughtou ROM Lumbar - Left Side Bend: WNL Lumbar - Right Rotation: 50% limited Lumbar - Left Rotation: 50% limited. Lumbar Strength Lumbar Flexion: 2+/5 Lumbar Extension: 2+/5  Exercise/Treatments Stretches Lower Trunk Rotation: Limitations Lower Trunk Rotation Limitations: 10x 2-3 second hold.  Supine Ab Set: 5 reps;3 seconds Bent Knee Raise: 10 reps;3 seconds Bridge: Compliant;10 reps  Manual Therapy Manual Therapy: Joint mobilization Joint Mobilization: joint mobilizations of L1-L5, noted pain at L3,L4, L5, discontineud due to pain with grade 2 posterior to anterior mobilizations.   Physical Therapy Assessment and Plan PT Assessment and Plan Clinical Impression Statement: Patient displays low back pain secondary to limited Rt sidebending due to pain. Patient's low back pain is attributed to excessive lumbar spine joint mobility due to limited  core stability and limited hip extension and internal  rotation mobility as well as limited hamstring mobility. these limitations result in limited biomechancis with poor posturing when lifting objects from the ground.  Pt will benefit from skilled therapeutic intervention in order to improve on the following deficits: Abnormal gait;Decreased activity tolerance;Decreased balance;Decreased coordination;Difficulty walking;Decreased strength;Decreased range of motion;Decreased mobility;Increased fascial restricitons;Increased muscle spasms;Impaired flexibility;Pain;Obesity;Improper spinal/pelvic alignment;Improper body mechanics Rehab Potential: Fair Clinical Impairments Affecting Rehab Potential: patient has a 30+ year history of low back pain PT Frequency: Min 2X/week PT Duration: 4 weeks PT Treatment/Interventions: Gait training;Stair training;Functional mobility training;Therapeutic activities;Therapeutic exercise;Balance training;Neuromuscular re-education;Modalities;Manual techniques;Patient/family education PT Plan: Intiial focus on increasing: abdominal strength while simulaneously increasing hip flexor, glut and hamstring mobility limitations. as LE mobility increases further focuse to shift towards increasing glut max/med strength.     Goals Home Exercise Program Pt/caregiver will Perform Home Exercise Program: For increased ROM PT Goal: Perform Home Exercise Program - Progress: Goal set today PT Short Term Goals Time to Complete Short Term Goals: 2 weeks PT Short Term Goal 1: Patient will be independent with HEP noting full adhearance with performance of exercises 2x a day or greater PT Short Term Goal 2: Patient will be able to perform straight leg raise to 70 degrees indicatign improved hamstring mobility to decrease strain on low back PT Short Term Goal 3: Patient will demonstrate a negative thomas test indicating increasd hip flexor mobility to decrease strain on low back and incrase stride length with gait.  PT Short Term Goal 4: Patient  will demosntraqte 3/5 MMT score for trunk flexion/extension to indicate improvign trunk stability so patient can perform bed mobility with only min assist  PT Short Term Goal 5: Pat6ient will be able to internally rotates bips bilaterally to > 30 degrees to be able to improve shock absorbtion with gait.  PT Long Term Goals Time to Complete Long Term Goals: 4 weeks PT Long Term Goal 1: Patient will be able to stand 2minutes without pain >2/10 to be able to make a microwaved meal independently PT Long Term Goal 2: Patient will be bale to walk 11minutes without pain >2/10 Long Term Goal 3: Patient will be able to stand and side bend to Rt with only 50% limitation indicating improved lumbar spine mobility Long Term Goal 4: Patient will demosntraqte 4/5 MMT score for trunk flexion/extension to indicate improvign trunk stability so patient can perform bed mobility independently  Problem List Patient Active Problem List   Diagnosis Date Noted  . Aortic stenosis 11/17/2013  . Chronic diastolic heart failure 14/43/1540  . Left carotid bruit 11/17/2013  . Encounter for therapeutic drug monitoring 06/14/2013  . Preoperative cardiovascular examination 05/11/2013  . Arthropathy of shoulder region 12/02/2012  . Long term current use of anticoagulant 07/19/2010  . MIXED HYPERLIPIDEMIA 03/16/2009  . Essential hypertension, benign 03/16/2009  . AORTIC ANEURYSM 03/16/2009  . Atrial fibrillation 02/19/2009    PT - End of Session Activity Tolerance: Patient tolerated treatment well General Behavior During Therapy: WFL for tasks assessed/performed PT Plan of Care PT Home Exercise Plan: bent knee raise, hooklying hip rotation, and bridges  PT Patient Instructions: 2-3 times a day 10+ reps per set.  Consulted and Agree with Plan of Care: Patient;Family member/caregiver Family Member Consulted: Wife  GP Functional Assessment Tool Used: FOTO: 69% limited Functional Limitation: Changing and maintaining  body position Changing and Maintaining Body Position Current Status (G8676): At least 60 percent but less than 80 percent impaired, limited or  restricted Changing and Maintaining Body Position Goal Status [phone]): At least 40 percent but less than 60 percent impaired, limited or restricted  [name] 12/08/2013, 10:43 AM  Physician Documentation Your signature is required to indicate approval of the treatment plan as stated above.  Please sign and either send electronically or make a copy of this report for your files and return this physician signed original.   Please mark one 1.__approve of plan  2. ___approve of plan with the following conditions.   ______________________________                                                          _____________________ Physician Signature                                                                                                             Date

## 2013-12-08 NOTE — Evaluation (Signed)
Occupational Therapy Evaluation  Patient Details  Name: Darren Parker MRN: [id number] Date of Birth: 1933-03-17  Today's Date: 12/08/2013 Time: 5573-2202 OT Time Calculation (min): 34 min OT eval 900-934 34'  Visit#: 1 of 24  Re-eval: 01/18/14     Authorization: Medicare  Authorization Time Period: before 10th visit  Authorization Visit#: 1 of 10   Past Medical History:  Past Medical History  Diagnosis Date  . Atrial fibrillation   . Hyperlipidemia   . Essential hypertension, benign   . Obstructive sleep apnea   . Type 2 diabetes mellitus   . Dilation of thoracic aorta     Ascending - CT scan 10/2008, 4.3 cm proximal   . 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 Prostate cancer   . Thrombocytopenia   . Iron deficiency anemia   . Venous insufficiency    Past Surgical History:  Past Surgical History  Procedure Laterality Date  . Right total knee replacement  2010    Subjective Symptoms/Limitations Symptoms: S: I can't raise my arm up. Pertinent History: On 06/29/13 patient underwent right partial shoulder replacement surgery. Patient was to begin therapy for shoulder 6 weeks post sx and was diagnosed with hypoxia and now uses supplemental oxygen. Shoulder therapy was put on hold due to  respiratory issues. Due to a late start in therapy, patient is experience more limitations with joint mobility in right shoulder as well as increased pain and fascial restrictions. Dr. Berenice Primas has referred patient to occupational therapy for evaluation and treatment.  Special Tests: FOTO score: 35/100 Patient Stated Goals: To be able to use right arm normal. Pain Assessment Currently in Pain?: Yes Pain Score: 2  Pain Location: Back Pain Orientation: Right Pain Type: Chronic pain Pain Onset: More than a month ago Pain Frequency: Constant Pain Relieving Factors: nothing Effect of Pain on Daily Activities: difficulty walking,  running, sitting, standing    Assessment  12/08/13 0900  Assessment  Diagnosis right shoulder partial replacement   Surgical Date 06/29/13  Prior Therapy None  Precautions  Precautions None  Balance Screen  Has the patient fallen in the past 6 months No  Home Living  Family/patient expects to be discharged to: Private residence  Living Arrangements Spouse/significant other  Prior Function  Level of Independence Independent with basic ADLs;Independent with gait  Driving Yes  Vocation Retired  ADL  ADL Comments difficulty reaching back and up, picking up heavy items, using right arm as dominant.   Vision - History  Baseline Vision Wears glasses all the time  Cognition  Overall Cognitive Status Within Functional Limits for tasks assessed  Arousal/Alertness Awake/alert  Orientation Level Oriented X4  Observation/Other Assessments  Observations 16.5 cm incisional scar on right UE.      RUE Assessment  RUE Assessment (assessed supine. IR/ER adducted. )  RUE PROM (degrees)  Right Shoulder Flexion 109 Degrees  Right Shoulder ABduction 90 Degrees  Right Shoulder Internal Rotation 71 Degrees  Right Shoulder External Rotation 30 Degrees  RUE Strength  Right Shoulder Flexion 3-/5  Right Shoulder ABduction 3-/5  Right Shoulder Internal Rotation 3-/5  Right Shoulder External Rotation 3-/5                              Palpation  Palpation Mod fascial restrictions in right upper arm, trapezius, and scapularis region.   Written Expression  Dominant Hand Right  Exercise/Treatments   Occupational Therapy Assessment and Plan OT Assessment and Plan Clinical Impression Statement: A: Pt is a 80 y/o male s/p right partial shoulder replacement causing increased pain and fascial restrictions and decreased strength and joint mobility resulting in difficulty completing BADL and leisure tasks.  Pt will benefit from skilled therapeutic intervention in order to improve on the  following deficits: Impaired UE functional use;Pain;Increased fascial restricitons;Decreased strength;Decreased range of motion Rehab Potential: Excellent OT Frequency: Min 2X/week OT Duration: 12 weeks OT Treatment/Interventions: Self-care/ADL training;Therapeutic exercise;Manual therapy;Patient/family education;Therapeutic activities;Modalities OT Plan: P:Pt will benefit from skilled OT interventions in order to decrease pain, increase ROM, increase strength, and increase overall RUE functional use. Treatment Plan: PROM, AAROM, and AROM, MFR and manual stretching, scapular strengtheing and proximal stabilization, RUE general strengthening   Goals Home Exercise Program Pt/caregiver will Perform Home Exercise Program: For increased ROM PT Goal: Perform Home Exercise Program - Progress: Goal set today Short Term Goals Time to Complete Short Term Goals: 6 weeks Short Term Goal 1: Patient will be educated on HEP. Short Term Goal 2: Patient will report a pain level of 5/10 with daily tasks.  Short Term Goal 3: Patient will increase PROM to [facility] to increase ability to complete dressing tasks.  Short Term Goal 4: Patient will increase RUE shoulder strength to 3/5 to increase ability to complete daily tasks with less difficulty.  Short Term Goal 5: Patient will decrease fascial restrictions from a mod to min amount.  Long Term Goals Time to Complete Long Term Goals: 12 weeks Long Term Goal 1: Patient will return to highest level of independence with all BADL and leisure tasks.  Long Term Goal 2: Patient will report a pain level of 3/10 with daily tasks.  Long Term Goal 3: Patient will increase AROM to [facility] to increase ability to reach into overhead cabinets. Long Term Goal 4: Patient will increase RUE shoulder strength to 4/5 to increase ability to complete daily tasks with less difficulty.  Long Term Goal 5: Patient will decrease fascial restrictions from a min to a trace amount.   Problem  List Patient Active Problem List   Diagnosis Date Noted  . Pain in joint, shoulder region 12/08/2013  . Muscle weakness (generalized) 12/08/2013  . Decreased range of motion of right shoulder 12/08/2013  . Aortic stenosis 11/17/2013  . Chronic diastolic heart failure 95/62/1308  . Left carotid bruit 11/17/2013  . Encounter for therapeutic drug monitoring 06/14/2013  . Preoperative cardiovascular examination 05/11/2013  . Arthropathy of shoulder region 12/02/2012  . Long term current use of anticoagulant 07/19/2010  . MIXED HYPERLIPIDEMIA 03/16/2009  . Essential hypertension, benign 03/16/2009  . AORTIC ANEURYSM 03/16/2009  . Atrial fibrillation 02/19/2009    End of Session Activity Tolerance: Patient tolerated treatment well General Behavior During Therapy: [facility] for tasks assessed/performed OT Plan of Care OT Home Exercise Plan: towel slides OT Patient Instructions: handout (scanned) Consulted and Agree with Plan of Care: Patient;Family member/caregiver Family Member Consulted: wife  GO Functional Assessment Tool Used: FOTO score: 35/100 (65% impaired) Functional Limitation: Carrying, moving and handling objects Carrying, Moving and Handling Objects Current Status [phone]): At least 60 percent but less than 80 percent impaired, limited or restricted Carrying, Moving and Handling Objects Goal Status [phone]): At least 20 percent but less than 40 percent impaired, limited or restricted  Ailene Ravel, OTR/L,CBIS   12/08/2013, 1:14 PM  Physician Documentation Your signature is required to indicate approval of the treatment plan as stated above.  Please sign and either send electronically or make a copy of this report for your files and return this physician signed original.  Please mark one 1.__approve of plan  2. ___approve of plan with the following conditions.   ______________________________                                                           _____________________ Physician Signature                                                                                                             Date

## 2013-12-21 ENCOUNTER — Ambulatory Visit (HOSPITAL_COMMUNITY)
Admission: RE | Admit: 2013-12-21 | Discharge: 2013-12-21 | Disposition: A | Discharge status: Home / Self Care | Payer: Medicare Other | Source: Ambulatory Visit | Attending: Family Medicine | Admitting: Family Medicine

## 2013-12-21 ENCOUNTER — Ambulatory Visit (HOSPITAL_COMMUNITY)
Admission: RE | Admit: 2013-12-21 | Discharge: 2013-12-21 | Disposition: A | Discharge status: Home / Self Care | Payer: Medicare Other | Source: Ambulatory Visit | Attending: *Deleted | Admitting: *Deleted

## 2013-12-21 DIAGNOSIS — IMO0001 Reserved for inherently not codable concepts without codable children: Secondary | ICD-10-CM | POA: Diagnosis not present

## 2013-12-21 NOTE — Progress Notes (Signed)
Occupational Therapy Treatment Patient Details  Name: Darren Parker MRN: [id number] Date of Birth: 1933-01-30  Today's Date: 12/21/2013 Time: 5366-4403 OT Time Calculation (min): 42 min MFR 474-259 12' Therex 830-900 30'  Visit#: 2 of 24  Re-eval: 01/18/14    Authorization: Medicare  Authorization Time Period: before 10th visit  Authorization Visit#: 2 of 10  Subjective Symptoms/Limitations Symptoms: S: My shoulder has been giving me a fit.  Pain Assessment Currently in Pain?: No/denies  Precautions/Restrictions  Precautions Precautions: None  Exercise/Treatments Supine Protraction: PROM;AAROM;10 reps Horizontal ABduction: PROM;AAROM;10 reps External Rotation: PROM;AAROM;10 reps Internal Rotation: PROM;AAROM;10 reps Flexion: PROM;AAROM;10 reps ABduction: PROM;AAROM;10 reps Seated Elevation: AROM;10 reps Extension: AROM;10 reps Row: AROM;10 reps Pulleys Flexion: 1 minute ABduction: 1 minute Therapy Ball Flexion: 15 reps ABduction: 15 reps    Manual Therapy Manual Therapy: Myofascial release Myofascial Release: Myofascial release to right upper arm, trapezius, and scapularis region to decrease fascial restrictions and increase joint mobility in a pain free zone.   Occupational Therapy Assessment and Plan OT Assessment and Plan Clinical Impression Statement: A: Initiated myofascial release (MFR), passive stretching, AAROM supine, and pulleys this session. Patient showed good response to MFR technique. Patient was not as tight as evaulation.  OT Plan: P: AAROM seated   Goals Short Term Goals Time to Complete Short Term Goals: 6 weeks Short Term Goal 1: Patient will be educated on HEP. Short Term Goal 1 Progress: Progressing toward goal Short Term Goal 2: Patient will report a pain level of 5/10 with daily tasks.  Short Term Goal 2 Progress: Progressing toward goal Short Term Goal 3: Patient will increase PROM to [facility] to increase ability to complete  dressing tasks.  Short Term Goal 3 Progress: Progressing toward goal Short Term Goal 4: Patient will increase RUE shoulder strength to 3/5 to increase ability to complete daily tasks with less difficulty.  Short Term Goal 4 Progress: Progressing toward goal Short Term Goal 5: Patient will decrease fascial restrictions from a mod to min amount.  Short Term Goal 5 Progress: Progressing toward goal Long Term Goals Time to Complete Long Term Goals: 12 weeks Long Term Goal 1: Patient will return to highest level of independence with all BADL and leisure tasks.  Long Term Goal 1 Progress: Progressing toward goal Long Term Goal 2: Patient will report a pain level of 3/10 with daily tasks.  Long Term Goal 2 Progress: Progressing toward goal Long Term Goal 3: Patient will increase AROM to [facility] to increase ability to reach into overhead cabinets. Long Term Goal 3 Progress: Progressing toward goal Long Term Goal 4: Patient will increase RUE shoulder strength to 4/5 to increase ability to complete daily tasks with less difficulty.  Long Term Goal 4 Progress: Progressing toward goal Long Term Goal 5: Patient will decrease fascial restrictions from a min to a trace amount.  Long Term Goal 5 Progress: Progressing toward goal  Problem List Patient Active Problem List   Diagnosis Date Noted  . Pain in joint, shoulder region 12/08/2013  . Muscle weakness (generalized) 12/08/2013  . Decreased range of motion of right shoulder 12/08/2013  . Aortic stenosis 11/17/2013  . Chronic diastolic heart failure 56/38/7564  . Left carotid bruit 11/17/2013  . Encounter for therapeutic drug monitoring 06/14/2013  . Preoperative cardiovascular examination 05/11/2013  . Arthropathy of shoulder region 12/02/2012  . Long term current use of anticoagulant 07/19/2010  . MIXED HYPERLIPIDEMIA 03/16/2009  . Essential hypertension, benign 03/16/2009  . AORTIC ANEURYSM 03/16/2009  .  Atrial fibrillation 02/19/2009    End of  Session Activity Tolerance: Patient tolerated treatment well General Behavior During Therapy: [facility] for tasks assessed/performed   Ailene Ravel, OTR/L,CBIS   12/21/2013, 9:08 AM

## 2013-12-21 NOTE — Progress Notes (Signed)
Physical Therapy Treatment Patient Details  Name: Darren Parker MRN: [id number] Date of Birth: [birth year]  Today's Date: 12/21/2013 Time: 5809-9833 PT Time Calculation (min): 45 min Charge: TE 8250-5397  Visit#: 2 of 8  Re-eval: 01/07/14 Assessment Diagnosis: Rt low back pain secondary to stiffness and degenerative disc disease Next MD Visit: Gevena Cotton: 12/29/13 Prior Therapy: no  Authorization: Medicare  Authorization Time Period:    Authorization Visit#:   of     Subjective: Symptoms/Limitations Symptoms: Pain scale 3-4/10 today Rt lower back with no radicular symptoms  Pain Assessment Currently in Pain?: Yes Pain Score: 3  Pain Location: Back Pain Orientation: Lower;Right  Precautions/Restrictions  Precautions Precautions: None  Exercise/Treatments Stretches Active Hamstring Stretch: 2 reps;30 seconds;Limitations Active Hamstring Stretch Limitations: supine with rope Lower Trunk Rotation: Limitations Lower Trunk Rotation Limitations: 10x 2-3 second hold.  Hip Flexor Stretch: 2 reps;30 seconds;Limitations Hip Flexor Stretch Limitations: 6in step Piriformis Stretch: Limitations Piriformis Stretch Limitations: begin supine next session Supine Ab Set: 10 reps;3 seconds Clam: 10 reps;5 seconds Bent Knee Raise: 10 reps;3 seconds Bridge: 10 reps;3 seconds Straight Leg Raise: 10 reps    Physical Therapy Assessment and Plan PT Assessment and Plan Clinical Impression Statement: Began PT POC for abdominal strenthening and hip mobility, pt able to demonstrate appropriate technique with all exercises with cueing for stability.  Pt with difficutly breathing, added wedge and 2 pillows in supine position.  Reviewed diaphragmaitc breathing techniques to improve breathing with noted less difficulty following cueing.  O2 sat stayed between 94 and 96% through out session.  No reports of increased pain through session. PT Plan: Continue focus on improving abdominal  strength and hip mobilty.  Next session begin piriformis stretch and Nustep to improve activity tolerance.  Progress to standing 3D hip excursion to improve weight shifting and gluteal med and max strengthening.      Goals PT Short Term Goals PT Short Term Goal 1: Patient will be independent with HEP noting full adhearance with performance of exercises 2x a day or greater PT Short Term Goal 2: Patient will be able to perform straight leg raise to 70 degrees indicatign improved hamstring mobility to decrease strain on low back PT Short Term Goal 2 - Progress: Progressing toward goal PT Short Term Goal 3: Patient will demonstrate a negative thomas test indicating increasd hip flexor mobility to decrease strain on low back and incrase stride length with gait.  PT Short Term Goal 3 - Progress: Progressing toward goal PT Short Term Goal 4: Patient will demosntraqte 3/5 MMT score for trunk flexion/extension to indicate improvign trunk stability so patient can perform bed mobility with only min assist  PT Short Term Goal 4 - Progress: Progressing toward goal PT Short Term Goal 5: Pat6ient will be able to internally rotates bips bilaterally to > 30 degrees to be able to improve shock absorbtion with gait.  PT Long Term Goals PT Long Term Goal 1: Patient will be able to stand 100minutes without pain >2/10 to be able to make a microwaved meal independently PT Long Term Goal 2: Patient will be bale to walk 42minutes without pain >2/10 Long Term Goal 3: Patient will be able to stand and side bend to Rt with only 50% limitation indicating improved lumbar spine mobility Long Term Goal 4: Patient will demosntraqte 4/5 MMT score for trunk flexion/extension to indicate improvign trunk stability so patient can perform bed mobility independently  Problem List Patient Active Problem List   Diagnosis Date Noted  .  Pain in joint, shoulder region 12/08/2013  . Muscle weakness (generalized) 12/08/2013  . Decreased  range of motion of right shoulder 12/08/2013  . Aortic stenosis 11/17/2013  . Chronic diastolic heart failure 30/86/5784  . Left carotid bruit 11/17/2013  . Encounter for therapeutic drug monitoring 06/14/2013  . Preoperative cardiovascular examination 05/11/2013  . Arthropathy of shoulder region 12/02/2012  . Long term current use of anticoagulant 07/19/2010  . MIXED HYPERLIPIDEMIA 03/16/2009  . Essential hypertension, benign 03/16/2009  . AORTIC ANEURYSM 03/16/2009  . Atrial fibrillation 02/19/2009    PT - End of Session Activity Tolerance: Patient tolerated treatment well General Behavior During Therapy: [facility] for tasks assessed/performed  GP    Aldona Lento 12/21/2013, 10:36 AM

## 2013-12-22 ENCOUNTER — Encounter (INDEPENDENT_AMBULATORY_CARE_PROVIDER_SITE_OTHER): Payer: Medicare Other

## 2013-12-22 DIAGNOSIS — R0989 Other specified symptoms and signs involving the circulatory and respiratory systems: Secondary | ICD-10-CM

## 2013-12-22 DIAGNOSIS — I6529 Occlusion and stenosis of unspecified carotid artery: Secondary | ICD-10-CM

## 2013-12-23 ENCOUNTER — Telehealth (HOSPITAL_COMMUNITY): Payer: Self-pay

## 2013-12-23 ENCOUNTER — Inpatient Hospital Stay (HOSPITAL_COMMUNITY): Admission: RE | Admit: 2013-12-23 | Payer: Medicare Other | Source: Ambulatory Visit | Admitting: Specialist

## 2013-12-26 ENCOUNTER — Ambulatory Visit (HOSPITAL_COMMUNITY): Admission: RE | Admit: 2013-12-26 | Payer: Medicare Other | Source: Ambulatory Visit | Admitting: Specialist

## 2013-12-27 ENCOUNTER — Telehealth: Payer: Self-pay | Admitting: *Deleted

## 2013-12-27 ENCOUNTER — Ambulatory Visit (INDEPENDENT_AMBULATORY_CARE_PROVIDER_SITE_OTHER): Payer: Medicare Other | Admitting: *Deleted

## 2013-12-27 DIAGNOSIS — I4891 Unspecified atrial fibrillation: Secondary | ICD-10-CM

## 2013-12-27 DIAGNOSIS — Z7901 Long term (current) use of anticoagulants: Secondary | ICD-10-CM

## 2013-12-27 DIAGNOSIS — Z5181 Encounter for therapeutic drug level monitoring: Secondary | ICD-10-CM

## 2013-12-27 LAB — POCT INR: INR: 1.8

## 2013-12-27 NOTE — Telephone Encounter (Signed)
Message copied by Laurine Blazer on Tue Dec 27, 2013 10:26 AM ------      Message from: MCDOWELL, Aloha Gell      Created: Thu Dec 22, 2013  6:47 PM       Reviewed. 1-39% bilateral ICA stenoses. Continue medical therapy. ------

## 2013-12-27 NOTE — Telephone Encounter (Signed)
Notes Recorded by Laurine Blazer, LPN on 7/84/7841 at 28:20 AM Wife Ozella Almond) notified.

## 2013-12-28 ENCOUNTER — Ambulatory Visit (HOSPITAL_COMMUNITY)
Admission: RE | Admit: 2013-12-28 | Discharge: 2013-12-28 | Disposition: A | Discharge status: Home / Self Care | Payer: Medicare Other | Source: Ambulatory Visit | Attending: *Deleted | Admitting: *Deleted

## 2013-12-28 DIAGNOSIS — IMO0001 Reserved for inherently not codable concepts without codable children: Secondary | ICD-10-CM | POA: Diagnosis not present

## 2013-12-28 NOTE — Progress Notes (Signed)
Physical Therapy Treatment Patient Details  Name: Darren Parker MRN: [id number] Date of Birth: 09-Sep-1932  Today's Date: 12/28/2013 Time: 1033-1100 PT Time Calculation (min): 27 min  Visit#: 3 of 8  Re-eval: 01/07/14 Authorization: Medicare  Charges:  therex 25  Subjective: Symptoms/Limitations Symptoms: Pt states he's having pain in his "tail bone" today, 3/10.  (patient was 15 minutes late for PT appt) Pain Assessment Currently in Pain?: Yes Pain Score: 3  Pain Location: Back Pain Orientation: Mid;Lower   Exercise/Treatments Stretches Active Hamstring Stretch: 2 reps;30 seconds;Limitations Active Hamstring Stretch Limitations: supine with rope Lower Trunk Rotation: Limitations Lower Trunk Rotation Limitations: 10x 2-3 second hold.  Piriformis Stretch: 2 reps;30 seconds Piriformis Stretch Limitations: supine with towel  Supine Bridge: 10 reps;3 seconds Straight Leg Raise: 10 reps     Physical Therapy Assessment and Plan PT Assessment and Plan Clinical Impression Statement: Unable to complete full session due to patient being late for appointment.  Pt requires constant VC/s for breathing techniques with activitiy.  Pt remained on 2L 02 during session without reports of increased pain. PT Plan: Continue focus on improving abdominal strength and hip mobilty.  Next session begin Nustep to improve activity tolerance.  Progress to standing 3D hip excursion and show standing stretches for hamstring to improve weight shifting and gluteal med and max strengthening.       Problem List Patient Active Problem List   Diagnosis Date Noted  . Pain in joint, shoulder region 12/08/2013  . Muscle weakness (generalized) 12/08/2013  . Decreased range of motion of right shoulder 12/08/2013  . Aortic stenosis 11/17/2013  . Chronic diastolic heart failure 24/26/8341  . Left carotid bruit 11/17/2013  . Encounter for therapeutic drug monitoring 06/14/2013  . Preoperative  cardiovascular examination 05/11/2013  . Arthropathy of shoulder region 12/02/2012  . Long term current use of anticoagulant 07/19/2010  . MIXED HYPERLIPIDEMIA 03/16/2009  . Essential hypertension, benign 03/16/2009  . AORTIC ANEURYSM 03/16/2009  . Atrial fibrillation 02/19/2009    PT - End of Session Activity Tolerance: Patient tolerated treatment well General Behavior During Therapy: WFL for tasks assessed/performed   Teena Irani, PTA/CLT 12/28/2013, 11:03 AM

## 2013-12-28 NOTE — Progress Notes (Signed)
Occupational Therapy Treatment Patient Details  Name: Darren Parker MRN: [id number] Date of Birth: Sep 03, 1932  Today's Date: 12/28/2013 Time: 3790-2409 OT Time Calculation (min): 40 min Manual 1105-1120 (15') Therapeutic Exercises 1120-1145 (60')  Visit#: 3 of 24  Re-eval: 01/18/14    Authorization:    Authorization Time Period: before 10th visit  Authorization Visit#: 3 of 10  Subjective Symptoms/Limitations Symptoms: "No pain no gain." Pain Assessment Currently in Pain?: Other (Comment) (pt would not give a pain score.) Pain Score: 3  Pain Location: Back Pain Orientation: Mid;Lower  Exercise/Treatments Supine Protraction: PROM;AAROM;10 reps Horizontal ABduction: PROM;AAROM;10 reps External Rotation: PROM;AAROM;10 reps Internal Rotation: PROM;AAROM;10 reps Flexion: PROM;AAROM;10 reps ABduction: PROM;AAROM;10 reps Seated Elevation: AROM;10 reps Extension: AROM;10 reps Row: AROM;10 reps Protraction: AAROM;10 reps Horizontal ABduction: AAROM;10 reps External Rotation: AAROM;10 reps Internal Rotation: AAROM;10 reps Flexion: AAROM;10 reps Abduction: AAROM;10 reps  Manual Therapy Manual Therapy: Myofascial release Myofascial Release: Myofascial release to right upper arm, trapezius, and scapularis region to decrease fascial restrictions and increase joint mobility in a pain free zone.    Occupational Therapy Assessment and Plan OT Assessment and Plan Clinical Impression Statement: continued MFR and passive stretcing this session.  pt toelrated well, but did have some increased pain with flexion and abduction. pt tolerated supine AAROM with cueing for form.  Added seated AAROM this session - pt tolerated well, with some increased pain in abduction.  Pt reports doing some of HEP - encouraged pt to complete all exercises, especially abduction. OT Plan: continue AAROM supine and seated.  add red scapular theraband.   Goals Short Term Goals Short Term Goal 1:  Patient will be educated on HEP. Short Term Goal 1 Progress: Progressing toward goal Short Term Goal 2: Patient will report a pain level of 5/10 with daily tasks.  Short Term Goal 2 Progress: Progressing toward goal Short Term Goal 3: Patient will increase PROM to [facility] to increase ability to complete dressing tasks.  Short Term Goal 3 Progress: Progressing toward goal Short Term Goal 4: Patient will increase RUE shoulder strength to 3/5 to increase ability to complete daily tasks with less difficulty.  Short Term Goal 4 Progress: Progressing toward goal Short Term Goal 5: Patient will decrease fascial restrictions from a mod to min amount.  Short Term Goal 5 Progress: Progressing toward goal Long Term Goals Long Term Goal 1: Patient will return to highest level of independence with all BADL and leisure tasks.  Long Term Goal 1 Progress: Progressing toward goal Long Term Goal 2: Patient will report a pain level of 3/10 with daily tasks.  Long Term Goal 2 Progress: Progressing toward goal Long Term Goal 3: Patient will increase AROM to [facility] to increase ability to reach into overhead cabinets. Long Term Goal 3 Progress: Progressing toward goal Long Term Goal 4: Patient will increase RUE shoulder strength to 4/5 to increase ability to complete daily tasks with less difficulty.  Long Term Goal 4 Progress: Progressing toward goal Long Term Goal 5: Patient will decrease fascial restrictions from a min to a trace amount.  Long Term Goal 5 Progress: Progressing toward goal  Problem List Patient Active Problem List   Diagnosis Date Noted  . Pain in joint, shoulder region 12/08/2013  . Muscle weakness (generalized) 12/08/2013  . Decreased range of motion of right shoulder 12/08/2013  . Aortic stenosis 11/17/2013  . Chronic diastolic heart failure 73/53/2992  . Left carotid bruit 11/17/2013  . Encounter for therapeutic drug monitoring 06/14/2013  . Preoperative  cardiovascular examination 05/11/2013   . Arthropathy of shoulder region 12/02/2012  . Long term current use of anticoagulant 07/19/2010  . MIXED HYPERLIPIDEMIA 03/16/2009  . Essential hypertension, benign 03/16/2009  . AORTIC ANEURYSM 03/16/2009  . Atrial fibrillation 02/19/2009    End of Session Activity Tolerance: Patient tolerated treatment well General Behavior During Therapy: [facility] for tasks assessed/performed  GO    Bea Graff, MS, OTR/L [phone]  12/28/2013, 11:58 AM

## 2014-01-02 ENCOUNTER — Ambulatory Visit (HOSPITAL_COMMUNITY)
Admission: RE | Admit: 2014-01-02 | Discharge: 2014-01-02 | Disposition: A | Discharge status: Home / Self Care | Payer: Medicare Other | Source: Ambulatory Visit | Attending: Family Medicine | Admitting: Family Medicine

## 2014-01-02 DIAGNOSIS — IMO0001 Reserved for inherently not codable concepts without codable children: Secondary | ICD-10-CM | POA: Diagnosis not present

## 2014-01-02 NOTE — Progress Notes (Signed)
Physical Therapy Treatment Patient Details  Name: Darren Parker MRN: [id number] Date of Birth: 1932/05/27  Today's Date: 01/02/2014 Time: 7564-3329 PT Time Calculation (min): 35 min Charge: TE 5188-4166  Visit#: 4 of 8  Re-eval: 01/07/14    Authorization: Medicare  Authorization Time Period:    Authorization Visit#:   of     Subjective: Symptoms/Limitations Symptoms: Pt stated his blood counts were low last blood test, required 2 units of blood through transfusion.   Pain Assessment Currently in Pain?: Yes Pain Score: 3  Pain Location: Back Pain Orientation: Lower;Right  Objective:   Exercise/Treatments Stretches Active Hamstring Stretch: 2 reps;30 seconds;Limitations Active Hamstring Stretch Limitations: supine with rope Lower Trunk Rotation: Limitations Lower Trunk Rotation Limitations: 10x 2-3 second hold.  Piriformis Stretch: 2 reps;30 seconds Piriformis Stretch Limitations: supine with manual Aerobic Stationary Bike: Nustep x 5 ' L1, resistnace 1 Standing Other Standing Lumbar Exercises: 3D hip excursion 10x Supine Bridge: 10 reps;3 seconds  Manual Therapy Manual Therapy: Myofascial release Myofascial Release: Myofascial release to right upper arm, trapezius, and scapularis region to decrease fascial restrictions and increase joint mobility in a pain free zone  Physical Therapy Assessment and Plan PT Assessment and Plan Clinical Impression Statement: Added Nustep low resiistance and intensity for activity tolerance.  Progressed to 3D hip excursion to improve weight distrubtion with gait and gluteal strengthening.  Pt required constant verbal cueing to breathing during exercises, O2 sat decreased to 88%, diagraphrmatic breathing techniques returned sat to 95%.  No reports of increased pain through session PT Plan: Continue focus on improving abdominal strength and hip mobilty.  Progress to standing stretches for hamstring to improve weight shifting and  gluteal med and max strengthening.      Goals PT Short Term Goals PT Short Term Goal 1: Patient will be independent with HEP noting full adhearance with performance of exercises 2x a day or greater PT Short Term Goal 2: Patient will be able to perform straight leg raise to 70 degrees indicatign improved hamstring mobility to decrease strain on low back PT Short Term Goal 3: Patient will demonstrate a negative thomas test indicating increasd hip flexor mobility to decrease strain on low back and incrase stride length with gait.  PT Short Term Goal 3 - Progress: Progressing toward goal PT Short Term Goal 4: Patient will demosntraqte 3/5 MMT score for trunk flexion/extension to indicate improvign trunk stability so patient can perform bed mobility with only min assist  PT Short Term Goal 4 - Progress: Progressing toward goal PT Short Term Goal 5: Pat6ient will be able to internally rotates bips bilaterally to > 30 degrees to be able to improve shock absorbtion with gait.  PT Long Term Goals PT Long Term Goal 1: Patient will be able to stand 6minutes without pain >2/10 to be able to make a microwaved meal independently PT Long Term Goal 1 - Progress: Progressing toward goal PT Long Term Goal 2: Patient will be bale to walk 52minutes without pain >2/10 Long Term Goal 3: Patient will be able to stand and side bend to Rt with only 50% limitation indicating improved lumbar spine mobility Long Term Goal 4: Patient will demosntraqte 4/5 MMT score for trunk flexion/extension to indicate improvign trunk stability so patient can perform bed mobility independently  Problem List Patient Active Problem List   Diagnosis Date Noted  . Pain in joint, shoulder region 12/08/2013  . Muscle weakness (generalized) 12/08/2013  . Decreased range of motion of right shoulder 12/08/2013  .  Aortic stenosis 11/17/2013  . Chronic diastolic heart failure 50/38/8828  . Left carotid bruit 11/17/2013  . Encounter for  therapeutic drug monitoring 06/14/2013  . Preoperative cardiovascular examination 05/11/2013  . Arthropathy of shoulder region 12/02/2012  . Long term current use of anticoagulant 07/19/2010  . MIXED HYPERLIPIDEMIA 03/16/2009  . Essential hypertension, benign 03/16/2009  . AORTIC ANEURYSM 03/16/2009  . Atrial fibrillation 02/19/2009    PT - End of Session Activity Tolerance: Patient tolerated treatment well General Behavior During Therapy: [facility] for tasks assessed/performed  GP    Aldona Lento 01/02/2014, 10:25 AM

## 2014-01-02 NOTE — Progress Notes (Signed)
Occupational Therapy Treatment Patient Details  Name: Darren Parker MRN: [id number] Date of Birth: Dec 17, 1932  Today's Date: 01/02/2014 Time: 4010-2725 OT Time Calculation (min): 36 min MFR: 3664-4034 14' Therex: 7425-9563 22'  Visit#: 4 of 24  Re-eval: 01/18/14    Authorization: Medicare  Authorization Time Period: before 10th visit  Authorization Visit#: 4 of 10  Subjective Symptoms/Limitations Symptoms: S: I'm tired and feel like I have no energy.  Pain Assessment Currently in Pain?: Yes Pain Score: 3  Pain Location: Back Pain Orientation: Lower;Right  Precautions/Restrictions  Precautions Precautions: None  Exercise/Treatments Supine Protraction: PROM;5 reps;AAROM;10 reps Horizontal ABduction: PROM;5 reps;AAROM;10 reps External Rotation: PROM;5 reps;AAROM;10 reps Internal Rotation: PROM;5 reps;AAROM;10 reps Flexion: PROM;5 reps;AAROM;10 reps ABduction: PROM;5 reps;AAROM;10 reps Seated Extension: Theraband;10 reps Theraband Level (Shoulder Extension): Level 2 (Red) Retraction: Theraband;10 reps Theraband Level (Shoulder Retraction): Level 2 (Red) Row: Theraband;10 reps Theraband Level (Shoulder Row): Level 2 (Red)                  Manual Therapy Manual Therapy: Myofascial release Myofascial Release: Myofascial release to right upper arm, trapezius, and scapularis region to decrease fascial restrictions and increase joint mobility in a pain free zone  Occupational Therapy Assessment and Plan OT Assessment and Plan Clinical Impression Statement: A: Began OT session late due to patient requiring rest break between PT and OT. Added red theraband exercises. Patient tolerated well with rest breaks.  Patient required frequent rest breaks during session due to shortness of breath and fatigue.  Wife present during treatment session.  OT Plan: P: Complete missed exercises. Continue working on increasing ROM during manual stretching.    Goals Short Term  Goals Short Term Goal 1: Patient will be educated on HEP. Short Term Goal 1 Progress: Progressing toward goal Short Term Goal 2: Patient will report a pain level of 5/10 with daily tasks.  Short Term Goal 2 Progress: Progressing toward goal Short Term Goal 3: Patient will increase PROM to [facility] to increase ability to complete dressing tasks.  Short Term Goal 3 Progress: Progressing toward goal Short Term Goal 4: Patient will increase RUE shoulder strength to 3/5 to increase ability to complete daily tasks with less difficulty.  Short Term Goal 4 Progress: Progressing toward goal Short Term Goal 5: Patient will decrease fascial restrictions from a mod to min amount.  Short Term Goal 5 Progress: Progressing toward goal Long Term Goals Long Term Goal 1: Patient will return to highest level of independence with all BADL and leisure tasks.  Long Term Goal 1 Progress: Progressing toward goal Long Term Goal 2: Patient will report a pain level of 3/10 with daily tasks.  Long Term Goal 2 Progress: Progressing toward goal Long Term Goal 3: Patient will increase AROM to [facility] to increase ability to reach into overhead cabinets. Long Term Goal 3 Progress: Progressing toward goal Long Term Goal 4: Patient will increase RUE shoulder strength to 4/5 to increase ability to complete daily tasks with less difficulty.  Long Term Goal 4 Progress: Progressing toward goal Long Term Goal 5: Patient will decrease fascial restrictions from a min to a trace amount.  Long Term Goal 5 Progress: Progressing toward goal  Problem List Patient Active Problem List   Diagnosis Date Noted  . Pain in joint, shoulder region 12/08/2013  . Muscle weakness (generalized) 12/08/2013  . Decreased range of motion of right shoulder 12/08/2013  . Aortic stenosis 11/17/2013  . Chronic diastolic heart failure 87/56/4332  . Left carotid bruit  11/17/2013  . Encounter for therapeutic drug monitoring 06/14/2013  . Preoperative  cardiovascular examination 05/11/2013  . Arthropathy of shoulder region 12/02/2012  . Long term current use of anticoagulant 07/19/2010  . MIXED HYPERLIPIDEMIA 03/16/2009  . Essential hypertension, benign 03/16/2009  . AORTIC ANEURYSM 03/16/2009  . Atrial fibrillation 02/19/2009    End of Session Activity Tolerance: Patient limited by fatigue General Behavior During Therapy: [facility] for tasks assessed/performed      Ailene Ravel, OTR/L,CBIS   01/02/2014, 12:03 PM

## 2014-01-04 ENCOUNTER — Inpatient Hospital Stay (HOSPITAL_COMMUNITY): Admission: RE | Admit: 2014-01-04 | Payer: Medicare Other | Source: Ambulatory Visit | Admitting: Physical Therapy

## 2014-01-04 ENCOUNTER — Ambulatory Visit (HOSPITAL_COMMUNITY): Payer: Medicare Other | Admitting: Physical Therapy

## 2014-01-07 ENCOUNTER — Other Ambulatory Visit: Payer: Self-pay | Admitting: Physician Assistant

## 2014-01-07 ENCOUNTER — Telehealth: Payer: Self-pay | Admitting: Physician Assistant

## 2014-01-07 MED ORDER — FUROSEMIDE 40 MG PO TABS: 60.0000 mg | ORAL_TABLET | Freq: Every day | ORAL | Status: AC

## 2014-01-07 NOTE — Telephone Encounter (Signed)
     Was on Lasix 40mg  BID but was having issues with getting up to go to the bathroom and so it was changed to Lasix 60mg  in the qAM by Dr. Domenic Polite in 11/2013. This was working well but he ran out of Lasix about a week ago and is now very volume overloaded. I refilled his Lasix and will have him take 40mg  tonight and resume 60mg  qAM. He knows to go to the ED if he become SOB and starts to decompensate. Will follow up in office.  Angelena Form PA-C  [facility]

## 2014-01-10 ENCOUNTER — Ambulatory Visit (HOSPITAL_COMMUNITY)
Admission: RE | Admit: 2014-01-10 | Discharge: 2014-01-10 | Disposition: A | Discharge status: Home / Self Care | Payer: Medicare Other | Source: Ambulatory Visit | Attending: Family Medicine | Admitting: Family Medicine

## 2014-01-10 DIAGNOSIS — M545 Low back pain, unspecified: Secondary | ICD-10-CM | POA: Insufficient documentation

## 2014-01-10 DIAGNOSIS — IMO0001 Reserved for inherently not codable concepts without codable children: Secondary | ICD-10-CM | POA: Diagnosis present

## 2014-01-10 DIAGNOSIS — R262 Difficulty in walking, not elsewhere classified: Secondary | ICD-10-CM | POA: Diagnosis not present

## 2014-01-10 DIAGNOSIS — I10 Essential (primary) hypertension: Secondary | ICD-10-CM | POA: Insufficient documentation

## 2014-01-10 DIAGNOSIS — E119 Type 2 diabetes mellitus without complications: Secondary | ICD-10-CM | POA: Diagnosis not present

## 2014-01-10 NOTE — Progress Notes (Signed)
Occupational Therapy Treatment Patient Details  Name: Darren Parker MRN: [id number] Date of Birth: Nov 06, 1932  Today's Date: 01/10/2014 Time: 9528-4132 OT Time Calculation (min): 42 min MFR: 4401-0272 14' Therex: 5366-4403 28'  Visit#: 5 of 24  Re-eval: 01/18/14    Authorization: Medicare  Authorization Time Period: before 10th visit  Authorization Visit#: 5 of 10  Subjective Symptoms/Limitations Symptoms: S: My shoulder feels pretty good. I have no pain at all, don't even know it's there.  Pain Assessment Currently in Pain?: No/denies  Precautions/Restrictions  Precautions Precautions: None  Exercise/Treatments Supine Protraction: PROM;5 reps;AAROM;10 reps Horizontal ABduction: PROM;5 reps;AAROM;10 reps External Rotation: PROM;5 reps;AAROM;10 reps Internal Rotation: PROM;5 reps;AAROM;10 reps Flexion: PROM;5 reps;AAROM;10 reps ABduction: PROM;5 reps;AAROM;10 reps   Therapy Ball Flexion: 15 reps ABduction: 15 reps    Manual Therapy Manual Therapy: Myofascial release Myofascial Release: Myofascial release to right upper arm, trapezius, and scapularis region to decrease fascial restrictions and increase joint mobility in a pain free zone  Occupational Therapy Assessment and Plan OT Assessment and Plan Clinical Impression Statement: A: Pt completed AAROM exercises in supine and therapy ball exercises.  Patient tolerated fair, continues to require frequent rest breaks during session due to shortness of breath and fatigue.  Wife and daughter present during treatment session.  OT Plan: P: Attempt pulleys. Try to complete missed exercises.    Goals Short Term Goals Short Term Goal 1: Patient will be educated on HEP. Short Term Goal 1 Progress: Progressing toward goal Short Term Goal 2: Patient will report a pain level of 5/10 with daily tasks.  Short Term Goal 2 Progress: Progressing toward goal Short Term Goal 3: Patient will increase PROM to [facility] to increase  ability to complete dressing tasks.  Short Term Goal 3 Progress: Progressing toward goal Short Term Goal 4: Patient will increase RUE shoulder strength to 3/5 to increase ability to complete daily tasks with less difficulty.  Short Term Goal 4 Progress: Progressing toward goal Short Term Goal 5: Patient will decrease fascial restrictions from a mod to min amount.  Short Term Goal 5 Progress: Progressing toward goal Long Term Goals Long Term Goal 1: Patient will return to highest level of independence with all BADL and leisure tasks.  Long Term Goal 1 Progress: Progressing toward goal Long Term Goal 2: Patient will report a pain level of 3/10 with daily tasks.  Long Term Goal 2 Progress: Progressing toward goal Long Term Goal 3: Patient will increase AROM to [facility] to increase ability to reach into overhead cabinets. Long Term Goal 3 Progress: Progressing toward goal Long Term Goal 4: Patient will increase RUE shoulder strength to 4/5 to increase ability to complete daily tasks with less difficulty.  Long Term Goal 4 Progress: Progressing toward goal Long Term Goal 5: Patient will decrease fascial restrictions from a min to a trace amount.  Long Term Goal 5 Progress: Progressing toward goal  Problem List Patient Active Problem List   Diagnosis Date Noted  . Pain in joint, shoulder region 12/08/2013  . Muscle weakness (generalized) 12/08/2013  . Decreased range of motion of right shoulder 12/08/2013  . Aortic stenosis 11/17/2013  . Chronic diastolic heart failure 47/42/5956  . Left carotid bruit 11/17/2013  . Encounter for therapeutic drug monitoring 06/14/2013  . Preoperative cardiovascular examination 05/11/2013  . Arthropathy of shoulder region 12/02/2012  . Long term current use of anticoagulant 07/19/2010  . MIXED HYPERLIPIDEMIA 03/16/2009  . Essential hypertension, benign 03/16/2009  . AORTIC ANEURYSM 03/16/2009  . Atrial  fibrillation 02/19/2009    End of Session Activity  Tolerance: Patient limited by fatigue General Behavior During Therapy: [facility] for tasks assessed/performed     Ailene Ravel, OTR/L,CBIS   01/10/2014, 12:01 PM

## 2014-01-10 NOTE — Evaluation (Signed)
Physical Therapy Re-assessment  Patient Details  Name: Darren Parker MRN: [id number] Date of Birth: Feb 26, 1933  Today's Date: 01/10/2014 Time: 1020-1100 PT Time Calculation (min): 40 min    Charges: TherEx 1020-1040, Self care 1040-1050, Gait training 1050-1100           Visit#: 5 of 8  Re-eval: 02/09/14 Assessment Diagnosis: Rt low back pain secondary to stiffness and degenerative disc disease Next MD Visit: Gevena Cotton: 12/29/13 Prior Therapy: no  Authorization: Medicare    Authorization Time Period:    Authorization Visit#:   of     Past Medical History:  Past Medical History  Diagnosis Date  . Atrial fibrillation   . Hyperlipidemia   . Essential hypertension, benign   . Obstructive sleep apnea   . Type 2 diabetes mellitus   . Dilation of thoracic aorta     Ascending - CT scan 10/2008, 4.3 cm proximal   . 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 Prostate cancer   . Thrombocytopenia   . Iron deficiency anemia   . Venous insufficiency    Past Surgical History:  Past Surgical History  Procedure Laterality Date  . Right total knee replacement  2010    Subjective Symptoms/Limitations Symptoms: patient had a very low blood cell count and recieved a blood transfusion. Patient states having been very worn out for the last 30 days. Patient has also had an iron transfusion.  Pertinent History: 30 year history of low back pain. history of cancer. X-rays showed no cancer, but did show degeneraive disc disease L3,L4,. History of bilateral knee andbilateral hip replacements.  Pain Assessment Currently in Pain?: No/denies  Precautions/Restrictions  Precautions Precautions: None  Balance Screening    Prior Functioning     Cognition/Observation    Sensation/Coordination/Flexibility/Functional Tests Functional Tests Functional Tests: 5x sit to stand: 27.56.  Functional Tests: 5min walk test: 122ft, below  house hould ambulation norms. unable to walk >60 seconds  Assessment Lumbar AROM Lumbar Flexion: 60 Lumbar Extension: 25 Lumbar - Right Side Bend: 100% limited pain at beginning and throughtou ROM Lumbar - Left Side Bend: WNL Lumbar - Right Rotation: 50% limited Lumbar - Left Rotation: 50% limited. Lumbar Strength Lumbar Flexion: 2+/5 Lumbar Extension: 2+/5  Exercise/Treatments Mobility/Balance  Bed Mobility Bed Mobility:  (requirs min assist)  Stretches Lower Trunk Rotation: Limitations Lower Trunk Rotation Limitations: 10x 2-3 second hold.  Standing Other Standing Lumbar Exercises: 3D hip excursion 10x Supine Bent Knee Raise: 10 reps;3 seconds Bridge: 10 reps;3 seconds Straight Leg Raise: 10 reps  Manual Therapy Manual Therapy: Myofascial release Myofascial Release: Myofascial release to right upper arm, trapezius, and scapularis region to decrease fascial restrictions and increase joint mobility in a pain free zone  Physical Therapy Assessment and Plan PT Assessment and Plan Clinical Impression Statement: Patient dmeosnttrated extremely low activity tolerance thissesion. Patient was educated in the need for a regular walking program instructed to walk 10x a day for >30 seconds. Patinet O2 saturation lavels remained between 92-85% throughhout session and did not improve with cuing for correct breathign pattern. Patient and wife stated thaeyu are going to the hospital for blood work today and were instructed to  go earlier if patient shows any signs of further increased fatigue.   PT Plan: Continue focus on improving abdominal strength and hip mobilty.  Progress to standing stretches for hamstring to improve weight shifting and gluteal med and max strengthening.  Goals PT Short Term Goals PT Short Term Goal 1: Patient will be independent with HEP noting full adhearance with performance of exercises 2x a day or greater PT Short Term Goal 1 - Progress: Progressing toward  goal PT Short Term Goal 2: Patient will be able to perform straight leg raise to 70 degrees indicatign improved hamstring mobility to decrease strain on low back PT Short Term Goal 2 - Progress: Progressing toward goal PT Short Term Goal 3: Patient will demonstrate a negative thomas test indicating increasd hip flexor mobility to decrease strain on low back and incrase stride length with gait.  PT Short Term Goal 3 - Progress: Progressing toward goal PT Short Term Goal 4: Patient will demosntraqte 3/5 MMT score for trunk flexion/extension to indicate improvign trunk stability so patient can perform bed mobility with only min assist  PT Short Term Goal 4 - Progress: Progressing toward goal PT Short Term Goal 5: Pat6ient will be able to internally rotates bips bilaterally to > 30 degrees to be able to improve shock absorbtion with gait.  PT Short Term Goal 5 - Progress: Progressing toward goal  Problem List Patient Active Problem List   Diagnosis Date Noted  . Pain in joint, shoulder region 12/08/2013  . Muscle weakness (generalized) 12/08/2013  . Decreased range of motion of right shoulder 12/08/2013  . Aortic stenosis 11/17/2013  . Chronic diastolic heart failure 35/36/1443  . Left carotid bruit 11/17/2013  . Encounter for therapeutic drug monitoring 06/14/2013  . Preoperative cardiovascular examination 05/11/2013  . Arthropathy of shoulder region 12/02/2012  . Long term current use of anticoagulant 07/19/2010  . MIXED HYPERLIPIDEMIA 03/16/2009  . Essential hypertension, benign 03/16/2009  . AORTIC ANEURYSM 03/16/2009  . Atrial fibrillation 02/19/2009    PT - End of Session Activity Tolerance: Patient tolerated treatment well General Behavior During Therapy: [facility] for tasks assessed/performed  GP Functional Assessment Tool Used: FOTO: 67% limited was 69% limited Functional Limitation: Changing and maintaining body position Changing and Maintaining Body Position Current Status  (X5400): At least 60 percent but less than 80 percent impaired, limited or restricted Changing and Maintaining Body Position Goal Status (Q6761): At least 40 percent but less than 60 percent impaired, limited or restricted  [name] 01/10/2014, 12:41 PM  Physician Documentation Your signature is required to indicate approval of the treatment plan as stated above.  Please sign and either send electronically or make a copy of this report for your files and return this physician signed original.   Please mark one 1.__approve of plan  2. ___approve of plan with the following conditions.   ______________________________                                                          _____________________ Physician Signature  Date  

## 2014-01-12 ENCOUNTER — Ambulatory Visit (HOSPITAL_COMMUNITY): Payer: Medicare Other | Admitting: Physical Therapy

## 2014-01-17 ENCOUNTER — Telehealth (HOSPITAL_COMMUNITY): Payer: Self-pay

## 2014-01-17 ENCOUNTER — Ambulatory Visit (HOSPITAL_COMMUNITY): Payer: Medicare Other

## 2014-01-17 NOTE — Telephone Encounter (Signed)
Wife called to cancel - left a message stating that patient didn't feel well today

## 2014-01-19 ENCOUNTER — Ambulatory Visit (HOSPITAL_COMMUNITY): Payer: Medicare Other

## 2014-01-19 ENCOUNTER — Telehealth (HOSPITAL_COMMUNITY): Payer: Self-pay

## 2014-01-19 NOTE — Telephone Encounter (Signed)
Patient is having a blood transfusion right now and more testing on the 22nd. He will try to keep the apptments on the 24th.

## 2014-01-24 ENCOUNTER — Telehealth (HOSPITAL_COMMUNITY): Payer: Self-pay

## 2014-01-24 ENCOUNTER — Ambulatory Visit (HOSPITAL_COMMUNITY): Payer: Medicare Other | Admitting: Specialist

## 2014-01-24 NOTE — Telephone Encounter (Signed)
Wife says he is in [facility] and will be going to a rehab facility when discharged.  She will call back to reschedule appts.

## 2014-01-26 ENCOUNTER — Ambulatory Visit (HOSPITAL_COMMUNITY): Payer: Medicare Other

## 2014-01-31 ENCOUNTER — Ambulatory Visit (HOSPITAL_COMMUNITY): Payer: Medicare Other | Admitting: Physical Therapy

## 2014-02-02 ENCOUNTER — Ambulatory Visit (HOSPITAL_COMMUNITY): Payer: Medicare Other

## 2014-02-02 DEATH — deceased

## 2014-02-06 NOTE — Progress Notes (Signed)
Patient Discharge Patient Details  Name: Darren Parker MRN: [id number] Date of Birth: 03-Sep-1932  Today's Date: 02/06/2014  Discharged due to inability to return due to complicating medical factors and hospitalization  [name] 02/06/2014, 1:27 PM

## 2014-02-06 NOTE — Addendum Note (Signed)
Encounter addended by: [name], PT on: 02/06/2014  1:30 PM<BR>     Documentation filed: Notes Section, Episodes

## 2014-02-20 NOTE — Addendum Note (Signed)
Encounter addended by: Debby Bud, OT on: 02/20/2014 10:29 AM<BR>     Documentation filed: Clinical Notes, Letters, Episodes

## 2014-02-20 NOTE — Progress Notes (Signed)
  Patient Details  Name: Darren Parker MRN: [id number] Date of Birth: Sep 14, 1932  Today's Date: 02/20/2014 Patient is discharged from OT services due to inability to return due to complicating medical factors and hospitalization. Patient will require a new Physician Order if he would like to return to therapy.  Ailene Ravel, OTR/L,CBIS   02/20/2014, 10:26 AM

## 2014-02-23 ENCOUNTER — Telehealth: Payer: Self-pay

## 2014-02-23 ENCOUNTER — Ambulatory Visit: Payer: Self-pay | Admitting: *Deleted

## 2014-02-23 DIAGNOSIS — Z5181 Encounter for therapeutic drug level monitoring: Secondary | ICD-10-CM

## 2014-02-23 DIAGNOSIS — I4891 Unspecified atrial fibrillation: Secondary | ICD-10-CM

## 2014-02-23 NOTE — Telephone Encounter (Signed)
Patient died @ [facility] per Darren Parker
